# Patient Record
Sex: Male | Born: 1945 | Race: White | Hispanic: No | Marital: Married | Smoking: Former smoker
Health system: Southern US, Community
[De-identification: ages and names within clinical notes are randomized; demographics above are authoritative.]

## PROBLEM LIST (undated history)

## (undated) DIAGNOSIS — I10 Essential (primary) hypertension: Secondary | ICD-10-CM

## (undated) DIAGNOSIS — E119 Type 2 diabetes mellitus without complications: Secondary | ICD-10-CM

## (undated) DIAGNOSIS — E039 Hypothyroidism, unspecified: Secondary | ICD-10-CM

## (undated) DIAGNOSIS — I2510 Atherosclerotic heart disease of native coronary artery without angina pectoris: Secondary | ICD-10-CM

## (undated) DIAGNOSIS — E785 Hyperlipidemia, unspecified: Secondary | ICD-10-CM

## (undated) DIAGNOSIS — I6529 Occlusion and stenosis of unspecified carotid artery: Secondary | ICD-10-CM

## (undated) DIAGNOSIS — N529 Male erectile dysfunction, unspecified: Secondary | ICD-10-CM

## (undated) DIAGNOSIS — E669 Obesity, unspecified: Secondary | ICD-10-CM

## (undated) DIAGNOSIS — I4891 Unspecified atrial fibrillation: Secondary | ICD-10-CM

## (undated) DIAGNOSIS — I9789 Other postprocedural complications and disorders of the circulatory system, not elsewhere classified: Secondary | ICD-10-CM

## (undated) DIAGNOSIS — G4733 Obstructive sleep apnea (adult) (pediatric): Secondary | ICD-10-CM

## (undated) HISTORY — DX: Hyperlipidemia, unspecified: E78.5

## (undated) HISTORY — DX: Hypothyroidism, unspecified: E03.9

## (undated) HISTORY — DX: Obesity, unspecified: E66.9

## (undated) HISTORY — PX: CORONARY ARTERY BYPASS GRAFT: SHX141

## (undated) HISTORY — DX: Other postprocedural complications and disorders of the circulatory system, not elsewhere classified: I97.89

## (undated) HISTORY — DX: Unspecified atrial fibrillation: I48.91

## (undated) HISTORY — DX: Obstructive sleep apnea (adult) (pediatric): G47.33

## (undated) HISTORY — DX: Occlusion and stenosis of unspecified carotid artery: I65.29

## (undated) HISTORY — PX: CARDIAC CATHETERIZATION: SHX172

## (undated) HISTORY — DX: Atherosclerotic heart disease of native coronary artery without angina pectoris: I25.10

## (undated) HISTORY — DX: Male erectile dysfunction, unspecified: N52.9

## (undated) HISTORY — DX: Essential (primary) hypertension: I10

## (undated) HISTORY — DX: Type 2 diabetes mellitus without complications: E11.9

---

## 2005-03-19 ENCOUNTER — Ambulatory Visit (HOSPITAL_COMMUNITY): Admission: RE | Admit: 2005-03-19 | Discharge: 2005-03-19 | Payer: Self-pay | Admitting: Vascular Surgery

## 2005-03-19 ENCOUNTER — Encounter: Payer: Self-pay | Admitting: Vascular Surgery

## 2005-03-22 ENCOUNTER — Ambulatory Visit (HOSPITAL_COMMUNITY): Admission: RE | Admit: 2005-03-22 | Discharge: 2005-03-22 | Payer: Self-pay | Admitting: Cardiology

## 2005-05-07 ENCOUNTER — Encounter (INDEPENDENT_AMBULATORY_CARE_PROVIDER_SITE_OTHER): Payer: Self-pay | Admitting: *Deleted

## 2005-05-07 ENCOUNTER — Ambulatory Visit (HOSPITAL_COMMUNITY): Admission: RE | Admit: 2005-05-07 | Discharge: 2005-05-08 | Payer: Self-pay | Admitting: Vascular Surgery

## 2007-07-17 ENCOUNTER — Inpatient Hospital Stay (HOSPITAL_COMMUNITY): Admission: AD | Admit: 2007-07-17 | Discharge: 2007-07-25 | Payer: Self-pay | Admitting: Cardiology

## 2007-07-20 ENCOUNTER — Ambulatory Visit: Payer: Self-pay | Admitting: Surgery

## 2007-08-13 ENCOUNTER — Ambulatory Visit: Payer: Self-pay | Admitting: Surgery

## 2007-08-20 ENCOUNTER — Encounter (HOSPITAL_COMMUNITY): Admission: RE | Admit: 2007-08-20 | Discharge: 2007-10-14 | Payer: Self-pay | Admitting: Cardiology

## 2007-10-15 ENCOUNTER — Encounter (HOSPITAL_COMMUNITY): Admission: RE | Admit: 2007-10-15 | Discharge: 2007-12-12 | Payer: Self-pay | Admitting: Cardiology

## 2010-09-13 DIAGNOSIS — E119 Type 2 diabetes mellitus without complications: Secondary | ICD-10-CM

## 2010-09-13 HISTORY — DX: Type 2 diabetes mellitus without complications: E11.9

## 2010-11-04 ENCOUNTER — Encounter: Payer: Self-pay | Admitting: Cardiology

## 2010-11-04 ENCOUNTER — Encounter: Payer: Self-pay | Admitting: Surgery

## 2011-02-26 NOTE — Op Note (Signed)
NAME:  Matthew [name], Matthew [name] NO.:  [id number]   MEDICAL RECORD NO.:  [id number]          PATIENT TYPE:  INP   LOCATION:  2313                         FACILITY:  [facility]   PHYSICIAN:  Evelene Croon, M.D.     DATE OF BIRTH:  [birth year]   DATE OF PROCEDURE:  07/20/2007  DATE OF DISCHARGE:                               OPERATIVE REPORT   PREOPERATIVE DIAGNOSIS:  Severe three-vessel coronary disease with  unstable angina.   POSTOPERATIVE DIAGNOSIS:  Severe three-vessel coronary disease with  unstable angina.   OPERATIVE PROCEDURE:  Emergency median sternotomy, extracorporeal  circulation, coronary bypass graft surgery x4 using a left internal  mammary artery graft to the left anterior descending coronary, with a  saphenous vein graft to the obtuse marginal branch of the left  circumflex coronary, a saphenous vein graft to the posterior descending  branch of the right coronary, and a saphenous vein graft to the acute  marginal coronary artery.  Endoscopic vein harvesting from the right  leg.   ATTENDING SURGEON:  Dr. Evelene Croon.   ASSISTANT:  Lenise Herald, [facility].   ANESTHESIA:  General endotracheal.   CLINICAL HISTORY:  This patient is a 64 year old gentleman with history  of hyperlipidemia and coronary disease who has known 80% right coronary  stenosis with left-to-right collaterals after catheterization in 2006.  He has been managed medically and has done well until the past month  when he developed exertional angina.  He underwent a stress test last  week which was markedly positive, and he was admitted for observation  and cardiac catheterization.  This was performed with morning and showed  a 95% hazy proximal LAD stenosis.  There was also about 60-70% proximal  left circumflex stenosis.  The right coronary artery was diffusely  diseased with 95% proximal and 80% midvessel stenosis.  The posterior  descending branch filled by left-to-right collaterals.  Left  ventricular  function was normal.  The patient had some chest pain over the weekend  and then had severe chest pain this morning prior to catheterization and  mild chest pain after catheterization.  I was asked to see the patient  in the catheterization lab and felt that proceeding with emergent  coronary bypass graft surgery was best treatment to prevent further  ischemia and infarction.  I discussed the operative procedure with the  patient and his wife.  We discussed alternatives, benefits, and risks  including but not limited to bleeding, blood transfusion, infection,  stroke, myocardial infarction, graft failure, and death.  They  understood and agreed to proceed.   OPERATIVE PROCEDURE:  The patient was taken to the operating room and  placed on table in supine position.  After induction of general  endotracheal anesthesia, a Foley catheter was placed in the bladder  using sterile technique.  Then, the chest, abdomen and both lower  extremities were prepped and draped in usual sterile manner.  The  patient had a right femoral arterial sheath which had been inserted in  the catheterization lab, and this was left in place and connected to  monitor.  Then, the chest, abdomen and  both lower extremities were  prepped and draped in usual sterile manner.  The chest was entered  through a median sternotomy incision in the pericardium of the midline.  Examination of the heart showed good ventricular contractility.  The  ascending aorta had no palpable plaques in it.   Then, the left internal mammary artery was harvested from the chest wall  as pedicle graft.  This is a medium caliber vessel with excellent blood  flow through it.  At the same time, a segment of greater saphenous vein  was harvested from the right leg using endoscopic vein harvest  technique.  This vein was of medium size and good quality.   Then, the patient was heparinized, and when an adequate activated  clotting time  was achieved, the distal ascending aorta was cannulated  using a 20-French aortic cannula for arterial inflow.  Venous outflow  was achieved using a two-stage venous cannula through the right atrial  appendage.  An antegrade cardioplegia and vent cannula were inserted  into the aortic root.   The patient was placed on cardiopulmonary bypass and the distal  coronaries identified.  The LAD was diffusely diseased throughout its  proximal and midportions.  Distally, a vessel was suitable for grafting.  The obtuse marginal branch was a large graftable vessel with no distal  disease in it.  The right coronary was diffusely diseased and gave off a  moderate-sized posterior descending branch that was heavily diseased  proximally.  There was also a moderate sized acute marginal branch that  was suitable for grafting.   Then, the aorta was crossclamped, and 1000 mL of cold blood antegrade  cardioplegia was administered in the aortic root with quick arrest of  the heart.  Systemic hypothermia to 28 degrees centigrade and topical  hypothermic iced saline was used.  A temperature probe was placed in  septum and insulating pad in the pericardium.  Additional doses of  antegrade cardioplegia were given down each vein graft.   The first distal anastomosis was performed to the obtuse marginal  branch.  The internal diameter of this vessel was about 1.75 to 2 mm.  Conduit used was a segment of greater saphenous vein and anastomosis  performed in end-to-side manner using continuous 7-0 Prolene suture.  Flow was noted through the graft and was excellent.   The second distal anastomosis was performed to the posterior descending  coronary artery.  The internal diameter of this vessel was about 1.6 mm.  Conduit used was a second segment of greater saphenous vein and the  anastomosis performed in an end-to-side manner using continuous 7-0  Prolene suture.  Flow was noted through the graft and was  excellent.   The third distal anastomosis was performed to the acute marginal branch  of the right coronary artery.  The internal diameter of his vessel was  1.6 mm.  Conduit used was a third segment of greater saphenous vein and  the anastomosis performed in an end-to-side manner using continuous 7-0  Prolene suture.  Flow was noted through the graft and was excellent.   The fourth distal anastomosis was performed to the distal LAD.  The  internal diameter was 1.75 mm.  Conduit used was the left internal  mammary graft, and this was brought through an opening in the left  pericardium anterior to the phrenic nerve.  It was anastomosed to the  LAD in an end-to-side manner using continuous 8-0 Prolene suture.  The  pedicle was sutured to  the epicardium with 6-0 Prolene sutures.  The  patient was rewarmed to 37 degrees centigrade.  With crossclamp in  place, three proximal vein graft anastomosis were performed in the  aortic root in an end-to-side manner using continuous 6-0 Prolene  suture.  The clamp was removed from mammary pedicle.  There was rapid  warming of the ventricular septum and return of spontaneous ventricular  fibrillation.  The crossclamp was removed at a time of 79 minutes, and  the patient was defibrillated into sinus rhythm.   The proximal and distal anastomoses appeared hemostatic and lay of the  grafts satisfactory.  Graft markers were placed around the proximal  anastomoses.  Two temporary right ventricular and right atrial pacing  wires were placed and brought the skin.   When the patient had rewarmed to 37 degrees centigrade, he was weaned  from cardiopulmonary bypass on no inotropic agents.  Total bypass time  was 96 minutes.  Cardiac function appeared excellent with a cardiac  output of 5 liters per minute.  Protamine was given, and the venous and  aortic cannula was removed without difficulty.  Hemostasis was achieved  without difficulty.  Three chest tubes  were placed with a tube in the  posterior pericardium, one in the left pleural space, and one in the  anterior mediastinum.  The sternum was then closed with double #6  stainless steel wires.  The fascia was closed with continuous #1 Vicryl  suture.  Subcutaneous tissue was closed with continuous 2-0 Vicryl and  skin with 3-0 Vicryl subcuticular closure.  The lower extremity vein  harvest site was closed in layers in a similar manner.  The sponge,  needle and counts were correct according to the scrub nurse.  Dry  sterile dressings were applied over the incisions, around the chest  tubes which were hooked to Pleur-Evac suction.  The patient remained  hemodynamically stable and was transported to the SICU in guarded but  stable condition.      Evelene Croon, M.D.  Electronically Signed     BB/MEDQ  D:  07/20/2007  T:  07/21/2007  Job:  [id number]   cc:   Evelene Croon, M.D.  Armanda Magic, M.D.

## 2011-02-26 NOTE — Cardiovascular Report (Signed)
NAME:  Matthew [name], Matthew [name] NO.:  [id number]   MEDICAL RECORD NO.:  [id number]          PATIENT TYPE:  INP   LOCATION:  2028                         FACILITY:  [facility]   PHYSICIAN:  Armanda Magic, M.D.     DATE OF BIRTH:  1945-10-27   DATE OF PROCEDURE:  07/20/2007  DATE OF DISCHARGE:  07/25/2007                            CARDIAC CATHETERIZATION   PROCEDURES:  1. Left heart catheterization.  2. Coronary angiography.  3. Left ventriculography.   OPERATOR:  Armanda Magic, M.D.   INDICATIONS:  Coronary disease and now with exertional angina and  abnormal Cardiolite.   The patient was brought to the cardiac catheterization laboratory in the  fasting nonsedated state.  Informed consent was obtained.  The patient  was connected to continue his heart rate and pulse oximetry monitoring,  intermittent blood pressure monitoring.  The right groin was prepped and  draped in a sterile fashion.  Xylocaine 1% was used for local  anesthesia.  Using a modified Seldinger technique, a 6-French sheath was  placed in the right femoral artery.  Under fluoroscopic guidance, a 6-  [place] JL4 catheter was placed in the left coronary artery.  Multiple  cine films were taken at 30 degree RAO, 40 degree LAO views.  This  catheter was then exchanged out over a guide wire for a 6 [place] JR4  catheter which was placed under fluoroscopic guidance in the right  coronary artery.  Multiple cine films are taken at 30 degree RAO , 40  degree LAO views.  The catheter was then exchanged out of a guide wire  for a 6 French angled pigtail catheter which was placed under  fluoroscopic guidance in the left ventricular cavity.  Left  ventriculography was performed in the 30 degree RAO view using a total  of 30 mL of contrast up to 10 mL per second.  The catheter was then  pulled back across the aortic valve with no significant gradient noted.  At the end of the procedure, all catheters and sheaths were  removed.  Manual compression was performed until adequate hemostasis was obtained.  The patient was transferred back to her room in stable condition.   RESULTS:  1. Left main coronary artery is widely patent and trifurcates in the      left anterior descending artery, ramus branch and left circumflex      artery.  2. The left anterior descending artery has an ostial 95% stenosis.  It      then gives rise to two large diagonal branches, both of which are      widely patent, as well as two septal perforator branches.  The      ongoing LAD traverses the apex and is widely patent.  3. There is a ramus branch which is small with 90% proximal stenosis.  4. The left circumflex has an eccentric 60 to 70% proximal stenosis      and then gives rise to the first obtuse marginal branch which is      rather large and widely patent.  The ongoing circumflex traverses      the  AV groove and gives rise to a second obtuse marginal branch      which is widely patent.  The left circumflex terminates in a third      obtuse marginal branch which is widely patent.  There is evidence      of left to right collaterals feeding the distal RCA.  5. The right coronary artery is diffusely diseased with a 90%  tubular      stenosis in the proximal portion and an 80% stenosis in the distal      portion.  The vessel then bifurcates in a posterior descending      artery, posterolateral artery, both of which are widely patent.  6. Left ventriculography shows normal LV function, EF 60%, LVEDP 10      mmHg, LV pressure 154/-4 mmHg.  Aortic pressure 150/74 mmHg.   ASSESSMENT:  1. Severe three-vessel coronary artery disease from left to right      collaterals to the distal right coronary artery.  2. Normal left ventricular function.   PLAN:  CVTS consult.  Start IV heparin, discontinue Lovenox and continue  aspirin.      Armanda Magic, M.D.  Electronically Signed     TT/MEDQ  D:  08/19/2007  T:  08/19/2007  Job:   [id number]   cc:   Pam Drown, M.D.

## 2011-02-26 NOTE — Consult Note (Signed)
NAME:  Matthew [name], Matthew [name] NO.:  [id number]   MEDICAL RECORD NO.:  [id number]          PATIENT TYPE:  INP   LOCATION:  2399                         FACILITY:  [facility]   PHYSICIAN:  Evelene Croon, M.D.     DATE OF BIRTH:  11-Jul-1946   DATE OF CONSULTATION:  07/20/2007  DATE OF DISCHARGE:                                 CONSULTATION   REASON FOR CONSULTATION:  Severe 3-vessel coronary disease with unstable  angina.   CLINICAL HISTORY:  I was asked by Dr. Armanda Magic to evaluate Mr.  [name] in the catheterization lab for consideration of coronary  artery bypass graft surgery.  He is a 65 year old gentleman with a  history of hyperlipidemia and known coronary disease who underwent  catheterization in 2006 after an abnormal Cardiolite scan done  preoperatively before a right carotid endarterectomy.  The  catheterization showed a proximal 80% right coronary stenosis with left  to right collaterals.  He was treated medically.  The patient continued  to do well and was asymptomatic until about 1 week prior to admission  when he began having exertional angina as well as some nocturnal angina.  He underwent a stress Cardiolite exam on July 17, 2007 which was  markedly abnormal with ST segment depression in the inferior lateral  leads.  There was diffuse ischemia.  He was admitted directly for  observation.  He did have mild chest pain over the weekend, and this  morning when he was being moved on to the gurney to come down to the  catheterization lab, he reported having sudden onset of 9/10 chest pain.  This improved with nitroglycerin.  He underwent cardiac catheterization  which showed hazy 95% proximal LAD stenosis.  There was a 95% small  intermediate stenosis.  There was 60 to 70% proximal dominant left  circumflex stenosis.  The right coronary artery had 90% proximal and 80%  mid vessel stenosis with left to right collaterals filling the posterior  descending branch.   Left ventricular function was normal.  LV EDP was  10.  The patient had 2/10 chest pain post catheterization and was  brought to the holding area.  He has had his nitroglycerin titrated  upwards and now become pain free.   PAST MEDICAL HISTORY:  1. His past medical history is significant for coronary artery disease      as mentioned above.  2. He has a history of hyperlipidemia.  3. He has a history of hyperthyroidism and is now on thyroid      replacement therapy.  4. He has a history of carotid artery disease, status post right      carotid endarterectomy in 2006.   REVIEW OF SYSTEMS:  GENERAL:  He denies any fever or chills.  He has had  no recent weight changes.  He has had recent fatigue.  EYES:  Negative.  ENT:  Negative.  ENDOCRINE:  He denies diabetes.  He does have  hyperthyroidism.  CARDIOVASCULAR:  As above.  He has exertional and rest  chest pain.  He denies shortness of breath with exertion.  He denies PND  and orthopnea.  He has had no peripheral edema.  RESPIRATORY:  He denies  cough and sputum production.  GI:  He has had no nausea or vomiting.  He  denies melena and bright red blood per rectum.  GU:  He denies dysuria  and hematuria.  MUSCULOSKELETAL:  Denies arthralgias and myalgias.  NEUROLOGICAL:  He denies any focal weakness or numbness.  Denies any  dizziness or syncope.  Never had a TIA or stroke.  He is status post  right carotid endarterectomy in 2006.   ALLERGIES:  IV CONTRAST DYE.   SOCIAL HISTORY:  He drinks alcohol occasionally.  He does not smoke.   FAMILY HISTORY:  His mother had cardiac disease.  His father had cancer.   MEDICATIONS PRIOR TO ADMISSION:  1. Synthroid 175 mcg daily.  2. HCTZ 37.5 mg daily.  3. Enteric-coated aspirin 325 mg daily.  4. Toprol XL 100 mg daily.  5. Norvasc 5 mg daily.  6. Saw palmetto daily.  7. Crestor 20 mg daily.  8. Imdur 30 mg daily.  9. Sublingual nitroglycerin p.r.n.  10.Fish oil daily.   PHYSICAL  EXAMINATION:  VITAL SIGNS:  His blood pressure is 150/74, pulse  is 70 and regular, respiratory rate is 18 unlabored.  GENERAL:  He is a well-developed white male in no distress.  HEENT EXAM:  Shows to be normocephalic, atraumatic.  Pupils equal and  reactive to light and accommodation.  Extraocular muscles are intact.  Throat is clear.  NECK EXAM:  Shows normal carotid pulses bilaterally.  There are no  bruits.  There is no adenopathy or thyromegaly.  There is an old right  neck scar from carotid endarterectomy.  CARDIAC EXAM:  Shows a regular rate and rhythm with a normal S1, S2.  There is no murmur, rub, or gallop.  LUNGS:  Clear.  ABDOMINAL EXAM:  Shows active bowel sounds.  His abdomen is soft, mildly  obese and nontender.  There are no palpable masses or organomegaly.  EXTREMITY EXAM:  Shows no peripheral edema.  Pedal pulses are palpable  bilaterally.  SKIN:  Warm and dry.  NEUROLOGIC EXAM:  Shows him to be alert and oriented x3.  Motor and  sensory exams are grossly normal.   IMPRESSION:  Matthew [name] has severe 3-vessel coronary disease with an  unstable appearing high-grade proximal left anterior descending stenosis  with unstable angina.  Since he had chest pain at rest this morning and  after catheterization, I think it would be best to proceed with  immediate emergent coronary artery bypass graft surgery.  I discussed  the operative procedure with the patient and his wife.  We discussed  alternatives, benefits, and risks including, but not limited to,  bleeding, blood transfusion, infection, stroke, myocardial infarction,  graft thoracotomy, and death.  He understands and agrees to proceed.      Evelene Croon, M.D.  Electronically Signed     BB/MEDQ  D:  07/20/2007  T:  07/20/2007  Job:  [id number]   cc:   Armanda Magic, M.D.

## 2011-02-26 NOTE — Assessment & Plan Note (Signed)
OFFICE VISIT   Matthew, [name]  DOB:  11/03/1945                                        August 13, 2007  CHART #:  [id number]   The patient returned today for followup status post emergency coronary  artery bypass graft surgery on 07/20/2007.  He said that he has been  feeling great and has had no chest pain or shortness of breath with  ambulation.   PHYSICAL EXAMINATION:  His blood pressure is 177/83 and his pulse is 63  and regular.  Respiratory rate is 18, nonlabored.  Oxygen saturation on  room air is 98%.  He looks well.  Cardiac exam shows a regular rate and  rhythm with normal heart sounds.  His lung exam is clear.  The chest  incision is healing well and the sternum is stable.  His leg incision is  healing well and there is no lower extremity edema.   Followup chest x-ray shows a small left pleural effusion blunting the  left costophrenic angle.  Lungs are otherwise clear.   MEDICATIONS:  1. Aspirin 325 mg daily.  2. Lopressor 50 mg b.i.d.  3. Vytorin 10/80 q.h.s.  4. Amiodarone 200 mg daily.  I told him to complete his amiodarone      prescription and then discontinue it.   IMPRESSION:  Overall, the patient is recovering well following his  surgery.  He has had no further evidence of postoperative atrial  fibrillation.  I encouraged him to continue walking as much as possible.  I told him he could return to driving a car but should refrain if  lifting anything heavier than 10 pounds for a total of 3 months from  date of surgery.  I strongly encouraged him  to participate in cardiac rehab.  He will continue to follow up with Dr.  Armanda Magic and will contact me if he develops any problems with his  incisions.   Evelene Croon, M.D.  Electronically Signed   BB/MEDQ  D:  08/13/2007  T:  08/14/2007  Job:  [id number]   cc:   Armanda Magic, M.D.

## 2011-02-26 NOTE — H&P (Signed)
NAME:  Matthew [name], Matthew [name] NO.:  [id number]   MEDICAL RECORD NO.:  [id number]          PATIENT TYPE:  INP   LOCATION:  4729                         FACILITY:  [facility]   PHYSICIAN:  Armanda Magic, M.D.     DATE OF BIRTH:  [birth year]   DATE OF ADMISSION:  07/17/2007  DATE OF DISCHARGE:                              HISTORY & PHYSICAL   CHIEF COMPLAINT:  Chest pain.   HISTORY OF PRESENT ILLNESS:  Mr. Augello is a 65 year old male  patient with a history of right coronary artery disease in 2006 as a  preop to right carotid endarterectomy.  He underwent cardiac  catheterization following an abnormal Cardiolite.  The catheterization  showed a proximal 80% right coronary artery lesion with left-to-right  collaterals.  He was treated medically.   Patient has been asymptomatic since that time until approximately one  week ago where he began experiencing exertional angina with some  nocturnal angina.  He came in today for a stress Cardiolite.  The test  was significantly positive with ST-segment depression, 3 mm downsloping  in the inferior lateral leads.  Images showed diffuse ischemia.  Now he  is being admitted directly for cardiac observation.   ALLERGIES:  IV CONTRAST ALLERGY.   MEDICATION:  1. Synthroid 175 mcg a day.  2. Hydrochlorothiazide 25 mg 1-1/2 tablet daily.  3. Enteric-coated aspirin 325 mg a day.  4. Toprol XL 100 mg a day.  5. Norvasc 10 mg 1/2 tablet daily.  6. Saw palmetto daily.  7. Crestor 20 mg a day.  8. Imdur 30 mg a day.  9. Sublingual nitroglycerin p.r.n.  10.Fish oil.   SOCIAL HISTORY:  No tobacco or illicit drug use.  Occasional alcohol  use.   FAMILY HISTORY:  Mom with pulmonary edema.  Dad with cancer.   PAST MEDICAL HISTORY:  1. CAD.  2. Hyperlipidemia.  3. He has had hyperthyroidism, and this has been treated and is now on      thyroid replacement therapy.  4. He has had carotid artery disease and has undergone a right carotid     endarterectomy.   PHYSICAL EXAMINATION:  VITAL SIGNS:  Pulse 93, respirations 152/72,  respirations 20.  HEENT:  Grossly normal.  No carotid or subclavian bruits.  No JVD,  thyromegaly.  Sclerae clear.  Conjunctivae normal.  Nares without  drainage.  Right carotid endarterectomy scar.  CHEST: Clear to auscultation bilaterally.  No wheeze, rhonchi.  HEART:  Regular rate and rhythm.  No rubs.  ABDOMEN:  Good bowel sounds, nontender, nondistended.  No mass, no  bruits.  EXTREMITIES:  No peripheral edema.  NEUROLOGICAL: Intact.  SKIN:  Warm and dry.   LABORATORY DATA:  Labs and chest x-ray pending.  EKG shows normal sinus  rhythm, rate 113 with nonspecific ST-T wave changes inferiorly.   ASSESSMENT/PLAN:  1. Unstable angina.  2. Coronary artery disease.  3. Abnormal Cardiolite.  4. IV contrast allergy.  5. Hypothyroidism following hyperthyroidism treatment.  6. Hypertension.  7. Hyperlipidemia.  8. Long-term medication use.   The patient is being admitted to the hospital, placed on  telemetry  monitoring.  Addition of aspirin, beta blocker, lipid lowering agents  and nitroglycerin.  Serial cardiac isoenzymes and EKGs.  The patient is  scheduled for cardiac catheterization on Monday July 20, 2007.      Guy Franco, P.A.      Armanda Magic, M.D.  Electronically Signed    LB/MEDQ  D:  07/17/2007  T:  07/19/2007  Job:  [id number]   cc:   Pam Drown, M.D.  Di Kindle. Edilia Bo, M.D.  Armanda Magic, M.D.

## 2011-02-26 NOTE — Discharge Summary (Signed)
NAME:  Matthew [name], Matthew [name] NO.:  [id number]   MEDICAL RECORD NO.:  [id number]          PATIENT TYPE:  INP   LOCATION:  2028                         FACILITY:  [facility]   PHYSICIAN:  Evelene Croon, M.D.     DATE OF BIRTH:  [birth year]   DATE OF ADMISSION:  07/17/2007  DATE OF DISCHARGE:  07/24/2007                               DISCHARGE SUMMARY   DISCHARGE DIAGNOSES:  1. Severe three-vessel coronary artery disease with unstable angina.  2. Postoperative atrial fibrillation.  3. Volume overload postoperatively.  4. Acute blood loss anemia postoperatively.  5. Hyperlipidemia.  6. History of hyperthyroidism currently on thyroid replacement      therapy.  7. History of carotid disease, status post right carotid      endarterectomy in 2006.   PROCEDURES:  1. Cardiac catheterization.  2. Emergent coronary artery bypass grafting x4 using a left internal      mammary artery graft to left anterior descending coronary,      saphenous vein graft to obtuse marginal branch of left circumflex      coronary artery, saphenous vein graft to posterior descending      branch right coronary artery, saphenous vein graft to acute      marginal coronary artery.  Endoscopic vein harvesting from right      leg.   HISTORY OF PRESENT ILLNESS:  The patient is a 65 year old gentleman with  history of hyperlipidemia and coronary artery disease who has known 80%  right coronary stenosis with left-to-right collaterals after  catheterization in 2006.  He has been managed medically and has been  well until the past month when he developed exertional angina.  He  underwent stress test last week which was markedly positive and the  patient was admitted for observation and cardiac catheterization.  Catheterization performed on October 6, showed a 95%, hazy, proximal LAD  stenosis.  There was also about 60-70%, proximal, left circumflex  stenosis.  The coronary artery was diffusely diseased with 95%  proximal  and 80% midvessel stenosis.  The posterior descending branch filled via  left-to-right collaterals.  Left ventricular function was noted to be  normal.  Following catheterization, Dr. Laneta Simmers was consulted.  Dr.  Laneta Simmers discussed with the patient undergoing coronary artery bypass  grafting.  He discussed with the patient risks and benefits of this  procedure.  He also discussed with the patient taking him to the  operating room emergently due to the size of the blockages in location.  The patient acknowledged understanding and agreed to proceed.  Surgery  was scheduled for July 20, 2007, emergent.  For details of the  patient's past medical history and physical exam, please see dictated  H&P.   HOSPITAL COURSE:  The patient was taken to the operating room July 20, 2007, where he underwent emergency coronary artery bypass grafting x4  using a left internal mammary artery graft to left anterior descending  coronary, saphenous vein graft to obtuse marginal branch to left  circumflex coronary, saphenous vein graft to posterior descending branch  of right coronary, saphenous vein graft to acute marginal coronary  artery.  Endoscopic vein harvesting from the right leg was done.  The  patient tolerated this procedure well and was transferred to the  intensive care unit in stable condition.  Postoperatively, the patient  was noted to be hemodynamically stable.  The patient was extubated the  evening of surgery.  Post extubation, the patient was noted to be alert  and oriented x4 and neurologic intact.  In the intensive care unit, his  vital signs were followed closely.  They were stable and he was able to  be weaned off all drips.  Swan-Ganz catheter discontinued in normal  fashion.  On postop day #1, chest x-ray was clear.  He had minimal  drainage from chest tubes.  Chest tube was discontinued on postop day  #1.  The patient was out of bed ambulating well with cardiac rehab on   postop day #1.  By postop day #2, unfortunately, he went into rapid  atrial fibrillation.  He was started on IV amiodarone.  After initiation  of IV amiodarone, the patient converted back to normal sinus rhythm.  He  remained stable and was able to be transferred out to 2000 postop day  #2.  While on telemetry floor, the patient's vital signs continued to be  monitored.  He remained afebrile.  The patient was able to be weaned off  oxygen saturating greater than 90% on room air.  The patient did have  slight hypertension, but medications were adjusted appropriately.  He  remained in normal sinus rhythm and IV amiodarone was able to be DC.  The patient was switched to p.o. amiodarone.  During the remainder of  his hospital course, the patient remained in normal sinus rhythm.  The  patient's pulmonary status remained stable.  His incisions were clean,  dry and intact and healing well.  The patient did have volume overload  postoperatively and was started on diuretics while in the intensive care  unit.  He was continued these on the floor.  By postop day #4, the  patient was still 2 kg above his preoperative weight.  This will  continue to be monitored.  The patient did have slight acute blood loss  anemia.  It dropped to 10.9 and 32.2 on postop day #2.  The patient was  asymptomatic and this was monitored.  Postoperatively, the patient was  out of bed ambulating well with assistance.  He was tolerating regular  diet well.  No nausea or vomiting noted.   If the patient remains stable and heart rate remains in normal sinus  rhythm, plan is to discharge the patient in the next 24 hours.   FOLLOWUP:  A  followup appointment has been arranged with Dr. Laneta Simmers for  August 11, 2007, at 12:30 p.m.  The patient will need to obtain PA and  lateral chest x-ray 30 minutes prior to this appointment.  Followup  appointment has been arranged with Dr. Mayford Knife for August 06, 2007, at 9  a.m.   ACTIVITY:   The patient is instructed in no driving until released to do  so, no lifting over 10 pounds.  He is told to ambulate 3-4 times per  day, progress as tolerated and to continue his breathing exercises.   WOUND CARE:  The patient is told to shower washing his incisions using  soap and water.  He is to contact the office if he develops any drainage  or opening from any of his incision sites.   DIET:  Educated on  diet to be low-fat, low-salt diet.   DISCHARGE MEDICATIONS:  1. Aspirin 325 mg daily.  2. Lopressor 50 mg b.i.d.  3. Vytorin 10/80 at night.  4. Amiodarone 400 mg b.i.d. x1 week, then 400 mg daily x1 week and      then 200 mg daily.  5. Lasix 40 mg daily x7 days.  6. Potassium chloride 20 mEq daily x7 days.  7. Synthroid 125 mcg daily.  8. Oxycodone 5 mg one to two tablets every 4-6 hours p.r.n. pain.      Theda Belfast, [place]      Evelene Croon, M.D.  Electronically Signed    KMD/MEDQ  D:  07/24/2007  T:  07/24/2007  Job:  [id number]   cc:   Armanda Magic, M.D.

## 2011-03-01 NOTE — Cardiovascular Report (Signed)
NAME:  Matthew [name], Matthew [name] NO.:  [id number]   MEDICAL RECORD NO.:  [id number]          PATIENT TYPE:  OIB   LOCATION:  2899                         FACILITY:  [facility]   PHYSICIAN:  Armanda Magic, M.D.     DATE OF BIRTH:  [birth year]   DATE OF PROCEDURE:  03/22/2005  DATE OF DISCHARGE:  03/22/2005                              CARDIAC CATHETERIZATION   PROCEDURES:  1.  Left heart catheterization.  2.  Coronary angiography.  3.  Left ventriculography.   OPERATOR:  Armanda Magic, M.D.   INDICATIONS:  Abnormal Cardiolite.   COMPLICATIONS:  None.   IV ACCESS:  Via right femoral artery with 6 French sheath.   This is a 65 year old white male who was recently noted to have bilateral  carotid artery bruits and carotid artery Doppler showed severe critical  stenosis of both carotid arteries.  He presented for preop evaluation prior  to undergoing carotid endarterectomy and was found to have an abnormal  stress test and now presents for heart catheterization.   The patient is brought to the cardiac catheterization laboratory in a  fasting nonsedated state.  Informed consent was obtained.  The patient was  connected to continuous heart rate and pulse oximetry monitoring,  intermittent blood pressure monitoring.  The right groin was prepped and  draped in a sterile fashion.  1% Xylocaine was used for local anesthesia.  Using modified Seldinger technique, a 6 French sheath was placed in the  right femoral artery.  Under fluoroscopic guidance, a 6 [place] JL-4 catheter  was placed in the left coronary artery.  Multiple cine films were taken at  30-degree RAO, 40-degree LAO views.  This catheter was then  exchanged out  over guide wire for 6 [place] JR-4 catheter which could not successfully  engage the coronary ostium.  The catheter was exchanged out for a 6 [place]  Noto right coronary artery catheter, again, which could not engage in the  right coronary artery.  The catheter was  then exchanged out over guide wire  for a 6 [place] AL2 catheter.  Again, unable to cannulate the right coronary  ostium.  The catheter was exchanged out over guide wire for a Williams right  coronary artery catheter, a 5 [place], which successfully cannulated the  coronary artery.  Multiple cine films were taken at 30-degree RAO, 40-degree  LAO views.  The catheter was then exchanged over guide wire for 6 French  angled pigtail catheter which was placed under fluoroscopic guidance in the  left ventricular cavity.  Left ventriculography was performed in 30-degree  RAO view using total 30 ml contrast at 15 ml per second.  The catheter was  then pulled back across the aortic valve with no significant gradient noted.  The catheter was then pulled back into the abdominal aorta just at the take  off of the renal arteries, and distal aortography was performed using a  total of 30 mL of contrast at 20 mL per second.  Catheter was then removed  over guide wire.  At the end of the procedure, all catheters and sheaths  were removed.  Manual compression was performed until adequate hemostasis  was obtained.  The patient was transferred back to the room in stable  condition.   RESULTS:  Left main coronary artery is widely patent and bifurcates into the  left anterior descending artery, and left circumflex artery.   The left anterior descending artery is widely patent throughout the course  of the apex and gives rise to three diagonal branches, all o which are  widely patent.  The left circumflex is widely patent throughout its course  giving rise to an obtuse marginal one and two branches; both of which are  widely patent.  The distal circumflex bifurcates into an obtuse marginal  three and posterior descending artery branch.  There is evidence of left-to-  right collaterals filling a nondominant right coronary artery.   The right coronary artery has an 80% ostial stenosis and gives rise to three   acute marginal branches and a posterior lateral branch.  The right coronary  artery is diffusely disease and small up to 80%.   Left ventricular pressure of 131/-1 mmHg, aortic pressure 135/74 mmHg, LV  function normal with EF 65%.   Distal aortography shows mild atherosclerosis of the distal abdominal aorta,  but no evidence of abdominal aortic aneurysm.   ASSESSMENT:  1.  One-vessel obstructive coronary disease of the right coronary artery      with left-to-right collaterals not amenable to percutaneous coronary      intervention secondary to diffuse disease in small caliber vessels.      Medical management.  2.  Normal left ventricular function.   PLAN:  Discharge home after IV fluid bed rest.  Toprol XL 25 mg daily, Imdur  30 mg daily.  Continue other medications.  Follow up with me in two to three  weeks.       TT/MEDQ  D:  03/22/2005  T:  03/22/2005  Job:  [id number]   cc:   Di Kindle. Edilia Bo, M.D.  [address]  [place]  [place] [address]

## 2011-03-01 NOTE — H&P (Signed)
NAME:  LORI, LIEW NO.:  [id number]   MEDICAL RECORD NO.:  [id number]          PATIENT TYPE:  INP   LOCATION:                               FACILITY:  [facility]   PHYSICIAN:  Di Kindle. Edilia Bo, M.D.DATE OF BIRTH:  [birth year]   DATE OF ADMISSION:  03/21/2005  DATE OF DISCHARGE:                                HISTORY & PHYSICAL   REASON FOR ADMISSION:  Tight right carotid stenosis.   HISTORY OF PRESENT ILLNESS:  This is a pleasant 65 year old gentleman who  was found to have a right carotid bruit by Dr. Pam Drown.  He  underwent a duplex scan which showed a right carotid stenosis, and he was  sent over for vascular consultation.  The patient denies any history of  previous stroke, TIA's, expressive or receptive aphasia, or amaurosis fugax.   PAST MEDICAL HISTORY:  1.  Significant for hypertension.  2.  Hypercholesterolemia.  He denies any history of diabetes, history of a previous myocardial  infarction, or a history of congestive heart failure.  1.  He does state that he had some problems with palpitations 15 years ago      and underwent a cardiac catheterization at that time, which showed some      mild coronary disease.  2.  At that time it turned out that he had hyperthyroidism and underwent an      ablation procedure.  He has had no further problems with palpitations.   FAMILY HISTORY:  His mother died with complications of coronary artery  disease and cancer when she was older.  His father died with esophageal  cancer.  He is unaware of any history of premature cardiovascular disease.   SOCIAL HISTORY:  He is married and has two children.  He is retired from  [employer].  He quit tobacco 15 years ago.   MEDICATIONS:  1.  Lotrel 10/20 mg, one p.o. daily.  2.  Zocor 40 mg p.o. daily.  3.  Synthroid 125 mcg p.o. daily.  4.  Hydrochlorothiazide 25 mg p.o. daily.  5.  Aspirin 325 mg p.o. daily.   ALLERGIES:  No known drug allergies.   REVIEW OF SYSTEMS:  GENERAL:  He has had some mild weight gain recently.  He  denies any change in appetite or fever.  CARDIAC:  He has had no chest pain,  chest pressure, palpitations or arrhythmias.  He does admit to some mild  dyspnea on exertion.  PULMONARY:  He has had no productive cough,  bronchitis, asthma or wheezing.  GI:  He has had no recent change in his  bowel habits, and has no history of peptic ulcer disease.  GENITOURINARY:  He has had no dysuria or frequency.  VASCULAR:  He has had no claudication,  rest pain or non-healing ulcers.  He has had no history of deep venous  thrombosis of phlebitis.  NEUROLOGIC:  He has had no dizziness, blackouts,  headaches or seizures.  ORTHOPEDIC/SKIN:  He does have a history of  arthritis.  He has had no muscle pain or rash.  PSYCHIATRIC:  He has had no  depression or nervousness.  HEMATOLOGIC:  He has had no bleeding problems or  clotting disorders.   PHYSICAL EXAMINATION:  VITAL SIGNS:  Blood pressure 140/82, heart rate 76.  NECK:  He has a right carotid bruit.  There is no cervical lymphadenopathy.  LUNGS:  Clear bilaterally to auscultation.  HEART:  He has a regular rate and rhythm.  ABDOMEN:  Soft, nontender.  I could not palpable an aneurysm.  EXTREMITIES:  He has palpable femoral pulses, palpable pedal pulses  bilaterally.  He has no significant lower extremity swelling.  NEUROLOGIC:  Exam is nonfocal.   A carotid duplex scan shows a very tight right carotid stenosis with a peak  systolic velocity of 475 cm per second with an end diastolic velocity of 149  cm per second.  The stenosis appears to be at the level of the bifurcation  and proximal internal carotid artery.  On the left side he has a 60%-79%  stenosis.  Both vertebral arteries are patent, with normally-directed flow.   PLAN:  Given the severity of the right carotid stenosis, I have recommended  a right carotid endarterectomy to lower his risk of future stroke.  We  have  discussed indications for the procedure and the potential complications,  including but not limited to bleeding, stroke (peri-procedural risks of 1%-  2%), myocardial infarction, nerve injury or other unpredictable medical  problems.  All of his questions were answered and he is agreeable to  proceed.  As he is asymptomatic, I think it would be safest to go ahead and  proceed with his stress test on March 18, 2005, and assuming that this is  unremarkable, will proceed with surgery on March 21, 2005.  He knows to  continue his aspirin right up through surgery.      CSD/MEDQ  D:  03/13/2005  T:  03/13/2005  Job:  [id number]   cc:   Pam Drown, M.D.  [address]  [place]  [place] [address]  Fax: [phone]

## 2011-03-01 NOTE — Op Note (Signed)
NAME:  OMA, ALPERT NO.:  [id number]   MEDICAL RECORD NO.:  [id number]          PATIENT TYPE:  OIB   LOCATION:  2899                         FACILITY:  [facility]   PHYSICIAN:  Di Kindle. Edilia Bo, M.D.DATE OF BIRTH:  1945-11-11   DATE OF PROCEDURE:  05/07/2005  DATE OF DISCHARGE:                                 OPERATIVE REPORT   PREOPERATIVE DIAGNOSIS:  Asymptomatic greater than 80% right carotid  stenosis.   POSTOPERATIVE DIAGNOSIS:  Asymptomatic greater than 80% right carotid  stenosis.   PROCEDURE:  Right carotid endarterectomy with Dacron patch angioplasty.   SURGEON:  Waverly Ferrari, M.D.   ASSISTANT:  Shonna Chock, PA   ANESTHESIA:  General.   INDICATIONS:  This is a 65 year old gentleman who was found to have a right  carotid bruit by Dr. Uvaldo Rising and this prompted a duplex scan which showed a  greater than 80% right carotid stenosis. Right carotid endarterectomy was  recommended in order to lower his risk of future stroke. The procedure and  potential complications including but not limited to bleeding, stroke  (periprocedural risk 1-2%), MI, nerve injury, or other unpredictable medical  problems were discussed with the patient preoperatively. All his questions  were answered and he was agreeable to proceed.   TECHNIQUE:  The patient was taken to the operating room and received a  general anesthetic. The right neck was prepped and draped in the usual  sterile fashion. An incision was made along the anterior border of the  sternocleidomastoid and the dissection carried down to the common carotid  artery which was dissected free and controlled with a Rumel tourniquet. The  fascial vein was divided between 2-0 silk ties and the internal carotid  artery above the plaque was controlled with a blue vessel loop. The external  carotid artery was controlled with a blue vessel loop. The patient was then  heparinized. Clamps were then placed on the  internal, then the common, then  the external carotid artery. A longitudinal arteriotomy was made in the  common carotid artery and this was extended through the plaque into the  internal carotid artery above the plaque. A #10 shunt was placed into the  internal carotid artery, back bled, and then placed into the common carotid  artery and secured with a Rumel tourniquet. Flow was reestablished to the  shunt. An endarterectomy plane was established proximally and the plaque was  sharply divided. Eversion endarterectomy was performed of the external  carotid artery. Distally, there was a nice taper in the plaque and no  tacking sutures were required. The artery was irrigated with copious amounts  of heparin and dextran, and all loose debris removed. Dacron patch was then  sewn using continuous 6-0 Prolene suture. Prior to completing the patch  closure, the shunt was removed, the arteries back bled and flushed  appropriately, and anastomosis completed. Flow was reestablished first to  the external carotid artery and then to the internal carotid artery. At the  completion, there was a good pulse distal to the patch and a good Doppler  signal. Hemostasis was obtained in the wound. The  wound was closed with a deep layer of 3-0 Vicryl. The platysma was closed  with a running 3-0 Vicryl. The skin was closed with a 4-0 subcuticular  stitch. A sterile dressing was applied. The patient tolerated the procedure  well and was transferred to the recovery room in satisfactory condition. All  needle and sponge counts were correct.       CSD/MEDQ  D:  05/07/2005  T:  05/07/2005  Job:  [id number]   cc:   Pam Drown, M.D.  [address]  [place]  [place] [address]  Fax: [phone]

## 2011-03-01 NOTE — H&P (Signed)
NAME:  Matthew [name], Matthew [name] NO.:  [id number]   MEDICAL RECORD NO.:  [id number]          PATIENT TYPE:  OIB   LOCATION:                               FACILITY:  [facility]   PHYSICIAN:  Di Kindle. Edilia Bo, M.D.DATE OF BIRTH:  1946/04/10   DATE OF ADMISSION:  05/07/2005  DATE OF DISCHARGE:                                HISTORY & PHYSICAL   CHIEF COMPLAINT:  Right carotid stenosis.   HISTORY OF PRESENT ILLNESS:  The patient is a 65 year old white male who was  recently referred to Dr. Edilia Bo by Dr. Gweneth Dimitri for evaluation of a  right internal carotid artery stenosis.  The patient recently moved here  from [place] and had seen Dr. Corliss Blacker to become established in her practice.  On physical examination he was noted to have a right carotid bruit.  He  subsequently underwent a duplex scan which showed greater than 80% stenosis  on the right with 60-79% stenosis on the left.  He denies any history of TIA  symptoms, visual changes, amaurosis fugax, dysphagia, dysarthria, weakness,  or syncope.  He was referred to Dr. Edilia Bo for evaluation for possible  surgical intervention.  Dr. Edilia Bo recommended proceeding with a right  carotid endarterectomy at this time in order to decrease his risk of stroke.  Because the patient had had a history of mild coronary artery disease in the  past, Dr. Edilia Bo recommended proceeding with a cardiac work-up by Dr. Armanda Magic prior to surgery.  He had a cardiac catheterization and was found to  have coronary artery disease, single vessel, around 80% stenosis of the  ostial right coronary artery.  This area had evidence of collaterals and Dr.  Mayford Knife felt that he should continue medical management at this time and  cleared him to proceed with surgery.  He returns today for follow-up and an  admission history and physical.  He has remained asymptomatic since his last  visit.   PAST MEDICAL HISTORY:  1.  Coronary artery disease managed  medically.  2.  Hypertension.  3.  Hyperlipidemia.  4.  History of Grave's disease status post thyroid ablation with secondary      hypothyroidism   PAST SURGICAL HISTORY:  1.  Appendectomy.  2.  Tonsillectomy.  3.  Thyroid ablation.   ALLERGIES:  No known drug allergies.  He does report an allergy to IV  CONTRAST DYE which causes itching.   CURRENT MEDICATIONS:  1.  Toprol XL 50 mg daily.  2.  Vytorin 10/80 daily.  3.  Synthroid 125 mcg daily.  4.  Hydrochlorothiazide 25 mg daily.  5.  Enteric-coated aspirin 325 mg daily.   SOCIAL HISTORY:  He is married and resides in [place] with his wife.  He  has two children.  He previously smoked a half pack of cigarettes per day or  few order and smoked for around 10 years.  He quit 15-20 years ago.  He also  occasionally consumes alcohol.  He is retired from the Ambulance person at  [employer].   FAMILY HISTORY:  His mother died of breast cancer.  She also had  hypertension and pulmonary edema.  His father died of esophageal cancer and  had a history of hypertension.  He has one brother who is alive and well.   REVIEW OF SYSTEMS:  See history of present illness for pertinent positives  and negatives.  Also, he reports occasional leg cramps secondary to his  Grave's disease.  He wears glasses.  He denies fevers, chills, recent  infections, weight loss, neurologic symptoms as described above, chest pain,  palpitations, shortness of breath, dyspnea on exertion, orthopnea,  paroxysmal nocturnal dyspnea, cough, wheezing, abdominal pain, nausea,  vomiting, diarrhea, constipation, reflux symptoms, hematochezia, melena,  hematemesis, hematuria, nocturia or dysuria, lower extremity edema,  claudication symptoms, rest pain, anxiety, depression, intolerance to heat  or cold.   PHYSICAL EXAMINATION:  VITAL SIGNS:  Blood pressure is 142/80, heart rate 64  and regular, respirations 18 and unlabored.  GENERAL:  This is a well-developed,  well-nourished white male in no acute  distress.  HEENT:  Normocephalic, atraumatic.  Pupils equal, round, and react to light  and accommodation.  Extraocular movements intact.  TMs and canals are clear.  Nares patent bilaterally.  Oropharynx clear with moist mucous membranes and  an upper partial and place.  NECK:  Supple without lymphadenopathy or thyromegaly.  There is an audible  right carotid bruit.  HEART:  Regular rate and rhythm without murmurs, rubs, or gallops.  LUNGS:  Clear to auscultation.  ABDOMEN:  Soft, obese, nontender, nondistended with active bowel sounds in  all quadrants.  No masses or hepatosplenomegaly.  EXTREMITIES:  No clubbing, cyanosis, or edema.  He has 2+ palpable femoral,  dorsalis pedis, and posterior tibial pulses bilaterally.  NEUROLOGIC:  Cranial nerves II-XII grossly intact.  He is alert and oriented  x3.  His gait is within normal limits.  Cerebellar function is intact.  His  muscle strength is symmetrical in the upper and lower extremities.   ASSESSMENT/PLAN:  This is a 65 year old male with asymptomatic right  internal carotid artery stenosis.  He will be admitted to [facility] on May 07, 2005 and will undergo a right carotid endarterectomy by  Dr. Waverly Ferrari.       GC/MEDQ  D:  05/03/2005  T:  05/03/2005  Job:  [id number]   cc:   Armanda Magic, M.D.  [address]  [place], [place] [address]  Fax: [phone]   Pam Drown, M.D.  [address]  [place]  [place] [address]  Fax: [phone]

## 2011-05-10 ENCOUNTER — Ambulatory Visit: Payer: Medicare Other | Attending: Family Medicine | Admitting: Physical Therapy

## 2011-05-10 DIAGNOSIS — M545 Low back pain, unspecified: Secondary | ICD-10-CM | POA: Insufficient documentation

## 2011-05-10 DIAGNOSIS — IMO0001 Reserved for inherently not codable concepts without codable children: Secondary | ICD-10-CM | POA: Insufficient documentation

## 2011-05-10 DIAGNOSIS — M2569 Stiffness of other specified joint, not elsewhere classified: Secondary | ICD-10-CM | POA: Insufficient documentation

## 2011-05-13 ENCOUNTER — Ambulatory Visit: Payer: Medicare Other | Admitting: Physical Therapy

## 2011-05-16 ENCOUNTER — Ambulatory Visit: Payer: Medicare Other | Attending: Family Medicine | Admitting: Physical Therapy

## 2011-05-16 DIAGNOSIS — M545 Low back pain, unspecified: Secondary | ICD-10-CM | POA: Insufficient documentation

## 2011-05-16 DIAGNOSIS — IMO0001 Reserved for inherently not codable concepts without codable children: Secondary | ICD-10-CM | POA: Insufficient documentation

## 2011-05-16 DIAGNOSIS — M2569 Stiffness of other specified joint, not elsewhere classified: Secondary | ICD-10-CM | POA: Insufficient documentation

## 2011-05-21 ENCOUNTER — Ambulatory Visit: Payer: Medicare Other | Admitting: Physical Therapy

## 2011-05-24 ENCOUNTER — Ambulatory Visit: Payer: Medicare Other | Admitting: Physical Therapy

## 2011-05-28 ENCOUNTER — Ambulatory Visit: Payer: Medicare Other | Admitting: Physical Therapy

## 2011-05-30 ENCOUNTER — Ambulatory Visit: Payer: Medicare Other | Admitting: Physical Therapy

## 2011-05-31 ENCOUNTER — Other Ambulatory Visit: Payer: Self-pay | Admitting: Family Medicine

## 2011-05-31 DIAGNOSIS — M545 Low back pain: Secondary | ICD-10-CM

## 2011-06-04 ENCOUNTER — Encounter: Payer: Medicare Other | Admitting: Physical Therapy

## 2011-06-04 ENCOUNTER — Ambulatory Visit
Admission: RE | Admit: 2011-06-04 | Discharge: 2011-06-04 | Disposition: A | Discharge status: Home / Self Care | Payer: Medicare Other | Source: Ambulatory Visit | Attending: Family Medicine | Admitting: Family Medicine

## 2011-06-04 DIAGNOSIS — M545 Low back pain: Secondary | ICD-10-CM

## 2011-06-06 ENCOUNTER — Encounter: Payer: Medicare Other | Admitting: Physical Therapy

## 2011-06-11 ENCOUNTER — Ambulatory Visit: Payer: Medicare Other | Admitting: Physical Therapy

## 2011-06-13 ENCOUNTER — Encounter: Payer: Medicare Other | Admitting: Physical Therapy

## 2011-06-28 ENCOUNTER — Inpatient Hospital Stay (HOSPITAL_COMMUNITY): Admission: RE | Admit: 2011-06-28 | Payer: Medicare Other | Source: Ambulatory Visit | Admitting: Neurological Surgery

## 2011-07-18 ENCOUNTER — Encounter (HOSPITAL_COMMUNITY)
Admission: RE | Admit: 2011-07-18 | Discharge: 2011-07-18 | Disposition: A | Discharge status: Home / Self Care | Payer: Medicare Other | Source: Ambulatory Visit | Attending: Neurological Surgery | Admitting: Neurological Surgery

## 2011-07-18 ENCOUNTER — Other Ambulatory Visit (HOSPITAL_COMMUNITY): Payer: Self-pay | Admitting: Neurological Surgery

## 2011-07-18 DIAGNOSIS — M48061 Spinal stenosis, lumbar region without neurogenic claudication: Secondary | ICD-10-CM

## 2011-07-18 LAB — CBC
Hemoglobin: 15.8 g/dL (ref 13.0–17.0)
MCH: 31.8 pg (ref 26.0–34.0)
MCHC: 35.2 g/dL (ref 30.0–36.0)
MCV: 90.3 fL (ref 78.0–100.0)
RBC: 4.97 MIL/uL (ref 4.22–5.81)

## 2011-07-18 LAB — BASIC METABOLIC PANEL
BUN: 15 mg/dL (ref 6–23)
CO2: 29 mEq/L (ref 19–32)
Calcium: 10 mg/dL (ref 8.4–10.5)
Chloride: 102 mEq/L (ref 96–112)
GFR calc non Af Amer: 69 mL/min — ABNORMAL LOW (ref >90)
Potassium: 4.5 mEq/L (ref 3.5–5.1)
Sodium: 140 mEq/L (ref 135–145)

## 2011-07-18 LAB — APTT: aPTT: 39 seconds — ABNORMAL HIGH (ref 24–37)

## 2011-07-18 LAB — DIFFERENTIAL
Basophils Relative: 1 % (ref 0–1)
Monocytes Absolute: 0.9 10*3/uL (ref 0.1–1.0)
Neutro Abs: 6.4 10*3/uL (ref 1.7–7.7)
Neutrophils Relative %: 67 % (ref 43–77)

## 2011-07-18 LAB — TYPE AND SCREEN: ABO/RH(D): A POS

## 2011-07-18 LAB — PROTIME-INR
INR: 0.97 (ref 0.00–1.49)
Prothrombin Time: 13.1 seconds (ref 11.6–15.2)

## 2011-07-24 ENCOUNTER — Inpatient Hospital Stay (HOSPITAL_COMMUNITY)
Admission: RE | Admit: 2011-07-24 | Discharge: 2011-07-25 | DRG: 491 | Disposition: A | Discharge status: Home / Self Care | Payer: Medicare Other | Source: Ambulatory Visit | Attending: Neurological Surgery | Admitting: Neurological Surgery

## 2011-07-24 ENCOUNTER — Inpatient Hospital Stay (HOSPITAL_COMMUNITY): Payer: Medicare Other

## 2011-07-24 DIAGNOSIS — I2510 Atherosclerotic heart disease of native coronary artery without angina pectoris: Secondary | ICD-10-CM | POA: Diagnosis present

## 2011-07-24 DIAGNOSIS — Z01818 Encounter for other preprocedural examination: Secondary | ICD-10-CM

## 2011-07-24 DIAGNOSIS — Z01812 Encounter for preprocedural laboratory examination: Secondary | ICD-10-CM

## 2011-07-24 DIAGNOSIS — Z01810 Encounter for preprocedural cardiovascular examination: Secondary | ICD-10-CM

## 2011-07-24 DIAGNOSIS — E119 Type 2 diabetes mellitus without complications: Secondary | ICD-10-CM | POA: Diagnosis present

## 2011-07-24 DIAGNOSIS — E669 Obesity, unspecified: Secondary | ICD-10-CM | POA: Diagnosis present

## 2011-07-24 DIAGNOSIS — Z87891 Personal history of nicotine dependence: Secondary | ICD-10-CM

## 2011-07-24 DIAGNOSIS — Z79899 Other long term (current) drug therapy: Secondary | ICD-10-CM

## 2011-07-24 DIAGNOSIS — M5126 Other intervertebral disc displacement, lumbar region: Principal | ICD-10-CM | POA: Diagnosis present

## 2011-07-24 DIAGNOSIS — Z7982 Long term (current) use of aspirin: Secondary | ICD-10-CM

## 2011-07-24 DIAGNOSIS — G4733 Obstructive sleep apnea (adult) (pediatric): Secondary | ICD-10-CM | POA: Diagnosis present

## 2011-07-24 DIAGNOSIS — I10 Essential (primary) hypertension: Secondary | ICD-10-CM | POA: Diagnosis present

## 2011-07-24 LAB — GLUCOSE, CAPILLARY
Glucose-Capillary: 132 mg/dL — ABNORMAL HIGH (ref 70–99)
Glucose-Capillary: 151 mg/dL — ABNORMAL HIGH (ref 70–99)
Glucose-Capillary: 155 mg/dL — ABNORMAL HIGH (ref 70–99)

## 2011-07-25 LAB — BASIC METABOLIC PANEL
BUN: 17
Calcium: 7.4 — ABNORMAL LOW
Calcium: 9
Creatinine, Ser: 1.25
GFR calc Af Amer: >60
GFR calc non Af Amer: 58 — ABNORMAL LOW
GFR calc non Af Amer: >60
GFR calc non Af Amer: >60
Glucose, Bld: 122 — ABNORMAL HIGH
Potassium: 3.7
Potassium: 4.1
Sodium: 137
Sodium: 140
Sodium: 143

## 2011-07-25 LAB — PROTIME-INR
INR: 1
INR: 1
INR: 1.3
Prothrombin Time: 12.9
Prothrombin Time: 16.1 — ABNORMAL HIGH

## 2011-07-25 LAB — URINALYSIS, ROUTINE W REFLEX MICROSCOPIC
Bilirubin Urine: NEGATIVE
Nitrite: NEGATIVE
Specific Gravity, Urine: 1.019
pH: 7

## 2011-07-25 LAB — COMPREHENSIVE METABOLIC PANEL
ALT: 43
AST: 31
Albumin: 3.9
Alkaline Phosphatase: 40
BUN: 11
CO2: 23
Calcium: 8.9
Chloride: 102
Creatinine, Ser: 1.02
GFR calc Af Amer: >60
GFR calc Af Amer: >60
GFR calc non Af Amer: >60
Glucose, Bld: 124 — ABNORMAL HIGH
Potassium: 3.7
Potassium: 3.9
Sodium: 138
Total Bilirubin: 1.2

## 2011-07-25 LAB — CBC
HCT: 35.7 — ABNORMAL LOW
HCT: 45.3
Hemoglobin: 10.9 — ABNORMAL LOW
Hemoglobin: 11.8 — ABNORMAL LOW
Hemoglobin: 12.2 — ABNORMAL LOW
Hemoglobin: 15.2
MCHC: 33.7
MCHC: 33.8
Platelets: 164
Platelets: 202
Platelets: 222
RBC: 4.23
RBC: 4.97
RDW: 13.1
RDW: 13.1
RDW: 13.5
WBC: 12.5 — ABNORMAL HIGH
WBC: 18.1 — ABNORMAL HIGH
WBC: 8.6

## 2011-07-25 LAB — LIPID PANEL
LDL Cholesterol: 69
Total CHOL/HDL Ratio: 3.7
Triglycerides: 134
VLDL: 27

## 2011-07-25 LAB — I-STAT EC8
BUN: 18
Glucose, Bld: 141 — ABNORMAL HIGH
HCT: 36 — ABNORMAL LOW
Hemoglobin: 12.2 — ABNORMAL LOW
Potassium: 4.2
Sodium: 140
TCO2: 24

## 2011-07-25 LAB — POCT I-STAT 3, ART BLOOD GAS (G3+)
O2 Saturation: 100
O2 Saturation: 94
Operator id: 277261
Operator id: 3402
pCO2 arterial: 38.5
pCO2 arterial: 39.2
pCO2 arterial: 43.6
pH, Arterial: 7.383
pO2, Arterial: 103 — ABNORMAL HIGH
pO2, Arterial: 272 — ABNORMAL HIGH

## 2011-07-25 LAB — CARDIAC PANEL(CRET KIN+CKTOT+MB+TROPI)
Relative Index: 1.1
Relative Index: 1.3
Total CK: 277 — ABNORMAL HIGH
Troponin I: 0.06
Troponin I: 0.11 — ABNORMAL HIGH

## 2011-07-25 LAB — POCT I-STAT 4, (NA,K, GLUC, HGB,HCT)
Glucose, Bld: 134 — ABNORMAL HIGH
Glucose, Bld: 139 — ABNORMAL HIGH
HCT: 31 — ABNORMAL LOW
HCT: 33 — ABNORMAL LOW
HCT: 47
Hemoglobin: 10.5 — ABNORMAL LOW
Hemoglobin: 11.2 — ABNORMAL LOW
Hemoglobin: 13.9
Hemoglobin: 14.3
Operator id: 3402
Operator id: 3402
Potassium: 4.7
Potassium: 5.6 — ABNORMAL HIGH
Sodium: 132 — ABNORMAL LOW
Sodium: 132 — ABNORMAL LOW
Sodium: 138
Sodium: 138
Sodium: 138

## 2011-07-25 LAB — CREATININE, SERUM
Creatinine, Ser: 1.26
GFR calc Af Amer: >60
GFR calc non Af Amer: 58 — ABNORMAL LOW

## 2011-07-25 LAB — APTT: aPTT: 37

## 2011-07-25 LAB — CROSSMATCH

## 2011-07-25 LAB — OCCULT BLOOD X 1 CARD TO LAB, STOOL: Fecal Occult Bld: NEGATIVE

## 2011-07-25 LAB — URINE MICROSCOPIC-ADD ON

## 2011-07-25 LAB — TROPONIN I: Troponin I: 0.1 — ABNORMAL HIGH

## 2011-07-25 LAB — PLATELET COUNT: Platelets: 186

## 2011-07-25 LAB — CK TOTAL AND CKMB (NOT AT ARMC): Relative Index: 2

## 2011-08-02 NOTE — Op Note (Signed)
NAME:  Matthew [name], Matthew [name] NO.:  [id number]  MEDICAL RECORD NO.:  [id number]  LOCATION:  3528                         FACILITY:  [facility]  PHYSICIAN:  Tia Alert, MD     DATE OF BIRTH:  [birth year]  DATE OF PROCEDURE:  07/24/2011 DATE OF DISCHARGE:                              OPERATIVE REPORT   PREOPERATIVE DIAGNOSIS:  Spinal stenosis at L3-4 with left L3-4 herniated nucleus pulposus with left L4 radiculopathy and leg weakness.  POSTOPERATIVE DIAGNOSIS:  Spinal stenosis at L3-4 with left L3-4 herniated nucleus pulposus with left L4 radiculopathy and leg weakness.  PROCEDURES:  Left lumbar hemilaminectomy, medial facetectomy, foraminotomy at L3-4 followed by sublaminar decompression for central canal, decompression followed by microdiskectomy at L3-4 on the left utilizing microscopic dissection.  SURGEON:  Tia Alert, MD  ANESTHESIA:  General endotracheal.  COMPLICATIONS:  None apparent.  INDICATION FOR PROCEDURE:  Mr. Hayner is a 65 year old gentleman who presented with pain in his left quadriceps with numbness and weakness. He had an MRI which showed spinal stenosis at L3-4 with a large free fragment extending down to the pedicle level compressing the left L4 nerve root.  I recommended decompressive laminectomy followed by microdiskectomy.  He understood the risks, benefits, and expected outcome and wished to proceed.  DESCRIPTION OF PROCEDURE:  The patient was taken to the operating room and after induction of adequate generalized endotracheal anesthesia he was rolled into the prone position on the Wilson frame.  All pressure points were padded.  His lumbar region was cleaned and prepped with DuraPrep and then draped in usual sterile fashion.  3 mL of local anesthesia injected and a dorsal midline incision was made and carried down to the lumbosacral fascia.  The fascia was opened and the paraspinous musculature was taken down in a subperiosteal  fashion to expose L3-4 on the left.  Intraoperative x-ray confirmed my level and then I was able to use the Kerrison punch and a high-speed drill to perform hemilaminectomy, medial facetectomy, and foraminotomy at L3-4 on the left side.  The underlying yellow ligament was opened and removed in a piecemeal fashion to expose the underlying dura and L4 nerve root.  I dissected down and removed the superior part of lamina of L4 to widen the foraminotomy until I was distal to the pedicle.  I drilled up under the spinous process and lamina on the right, performed a sublaminar decompression until I could see the lateral recess on the patient's right side.  I then brought in the operating microscope.  I coagulated the epidural venous vasculature.  I found a free fragment distally at the pedicle level.  This was removed with a nerve hook.  I then removed several fragments with a pituitary rongeur.  I undercut the lateral recess to further decompress the lateral recess.  I inspected the disk. I did not find a large annular tear.  The disk was bulging as was seen on the MRI, but I felt like cutting the anulus and performed intradiscal diskectomy would only increase the risk of recurrent disk herniation for him and therefore I decided not to cut the disk and perform an intradiscal discectomy and simply rely on  the decompressive laminectomy and the fragmentectomy.  I then irrigated with saline solution containing bacitracin, dried all bleeding points with Surgifoam and bipolar cautery and lined the dura with Gelfoam.  After irrigating with copious amounts of bacitracin-containing saline solution, I placed a medium Hemovac drain through a separate stab incision and closed the fascia with 0 Vicryl, closed the subcu and subcuticular tissue with 2-0 and 3-0 Vicryl, and closed the skin with benzoin and Steri-Strips.  The drapes were removed.  Sterile dressing was applied.  The patient awakened from  anesthesia and transferred to the recovery in stable condition.  At the end of procedure, all sponge, needle, and instrument counts were correct.     Tia Alert, MD     DSJ/MEDQ  D:  07/24/2011  T:  07/24/2011  Job:  [id number]  Electronically Signed by Marikay Alar MD on 08/02/2011 10:50:30 AM

## 2012-01-02 ENCOUNTER — Ambulatory Visit: Payer: Medicare Other | Admitting: Physical Therapy

## 2012-01-07 ENCOUNTER — Ambulatory Visit: Payer: Medicare Other | Admitting: Physical Therapy

## 2012-05-12 IMAGING — CR DG CHEST 2V
2 series · 2 of 2 positions shown · non-contrast
Comparison: Chest x-ray 07/22/2007.

CLINICAL DATA: Preop for lumbar surgery.

CHEST - 2 VIEW

[view not recorded (1 of 2)]
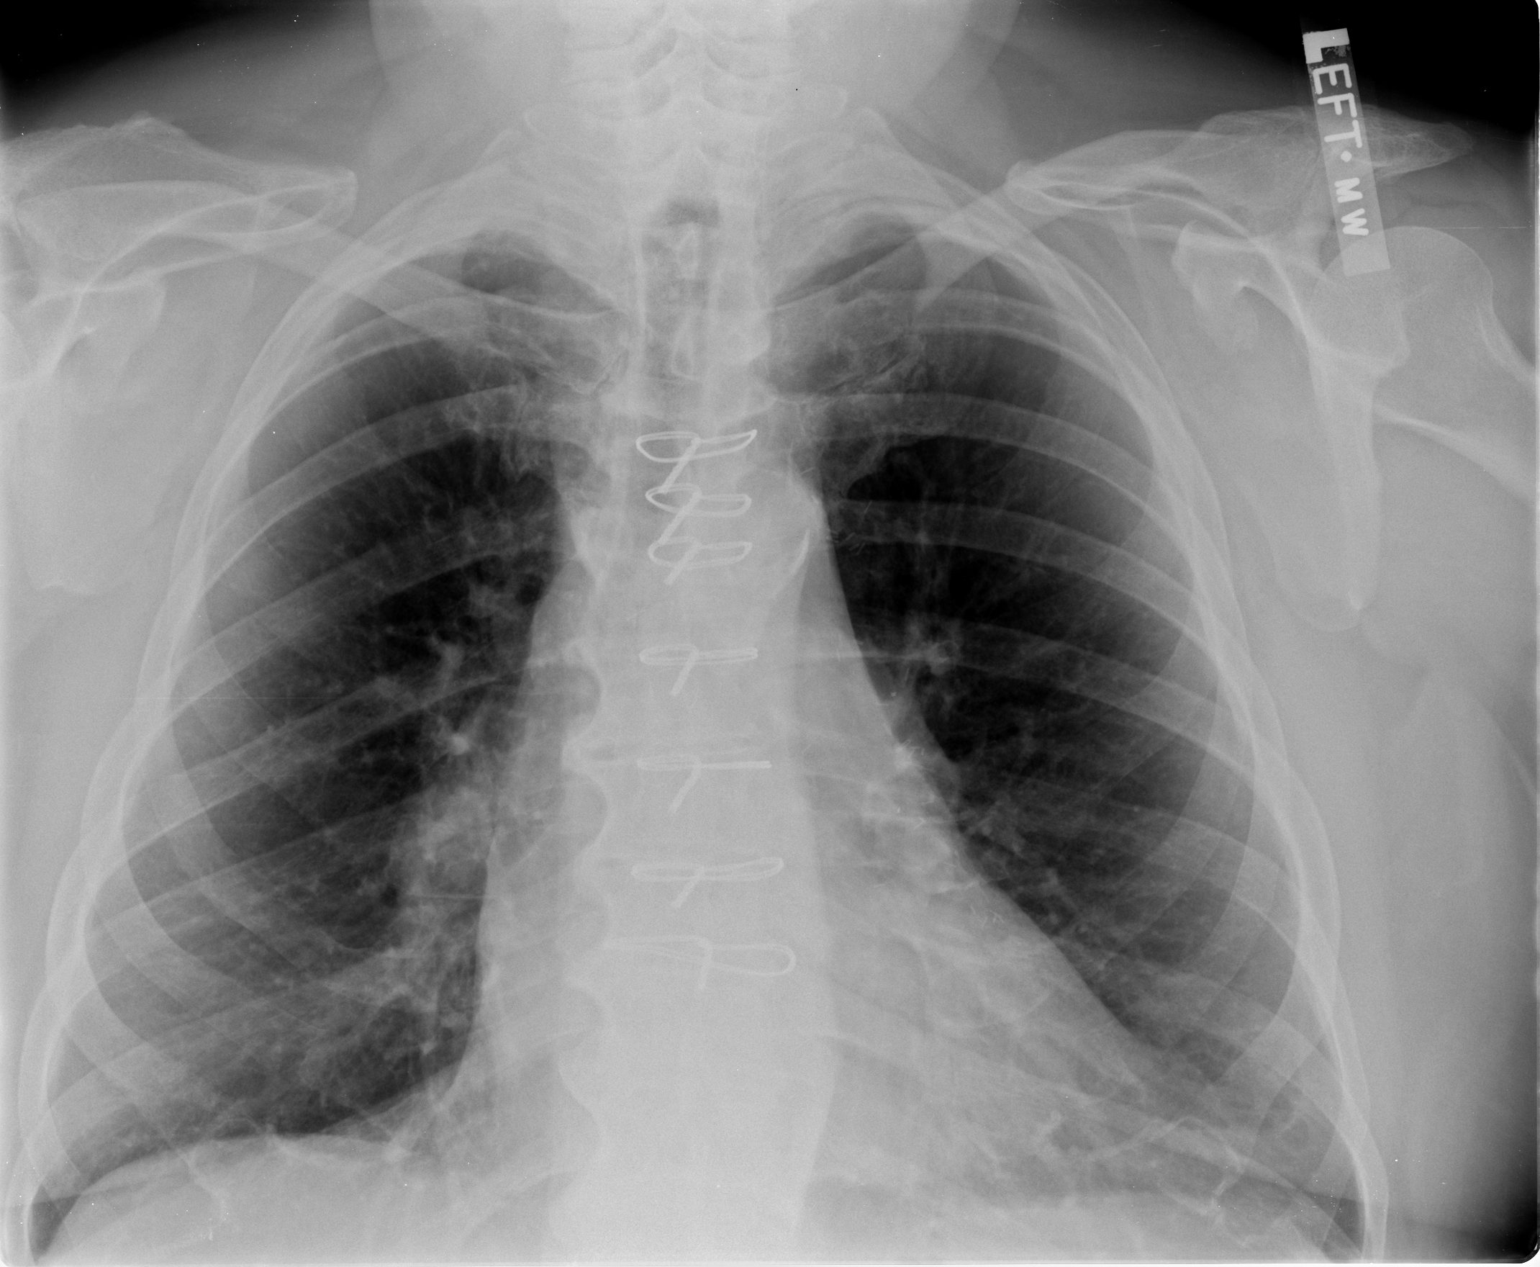

[view not recorded (2 of 2)]
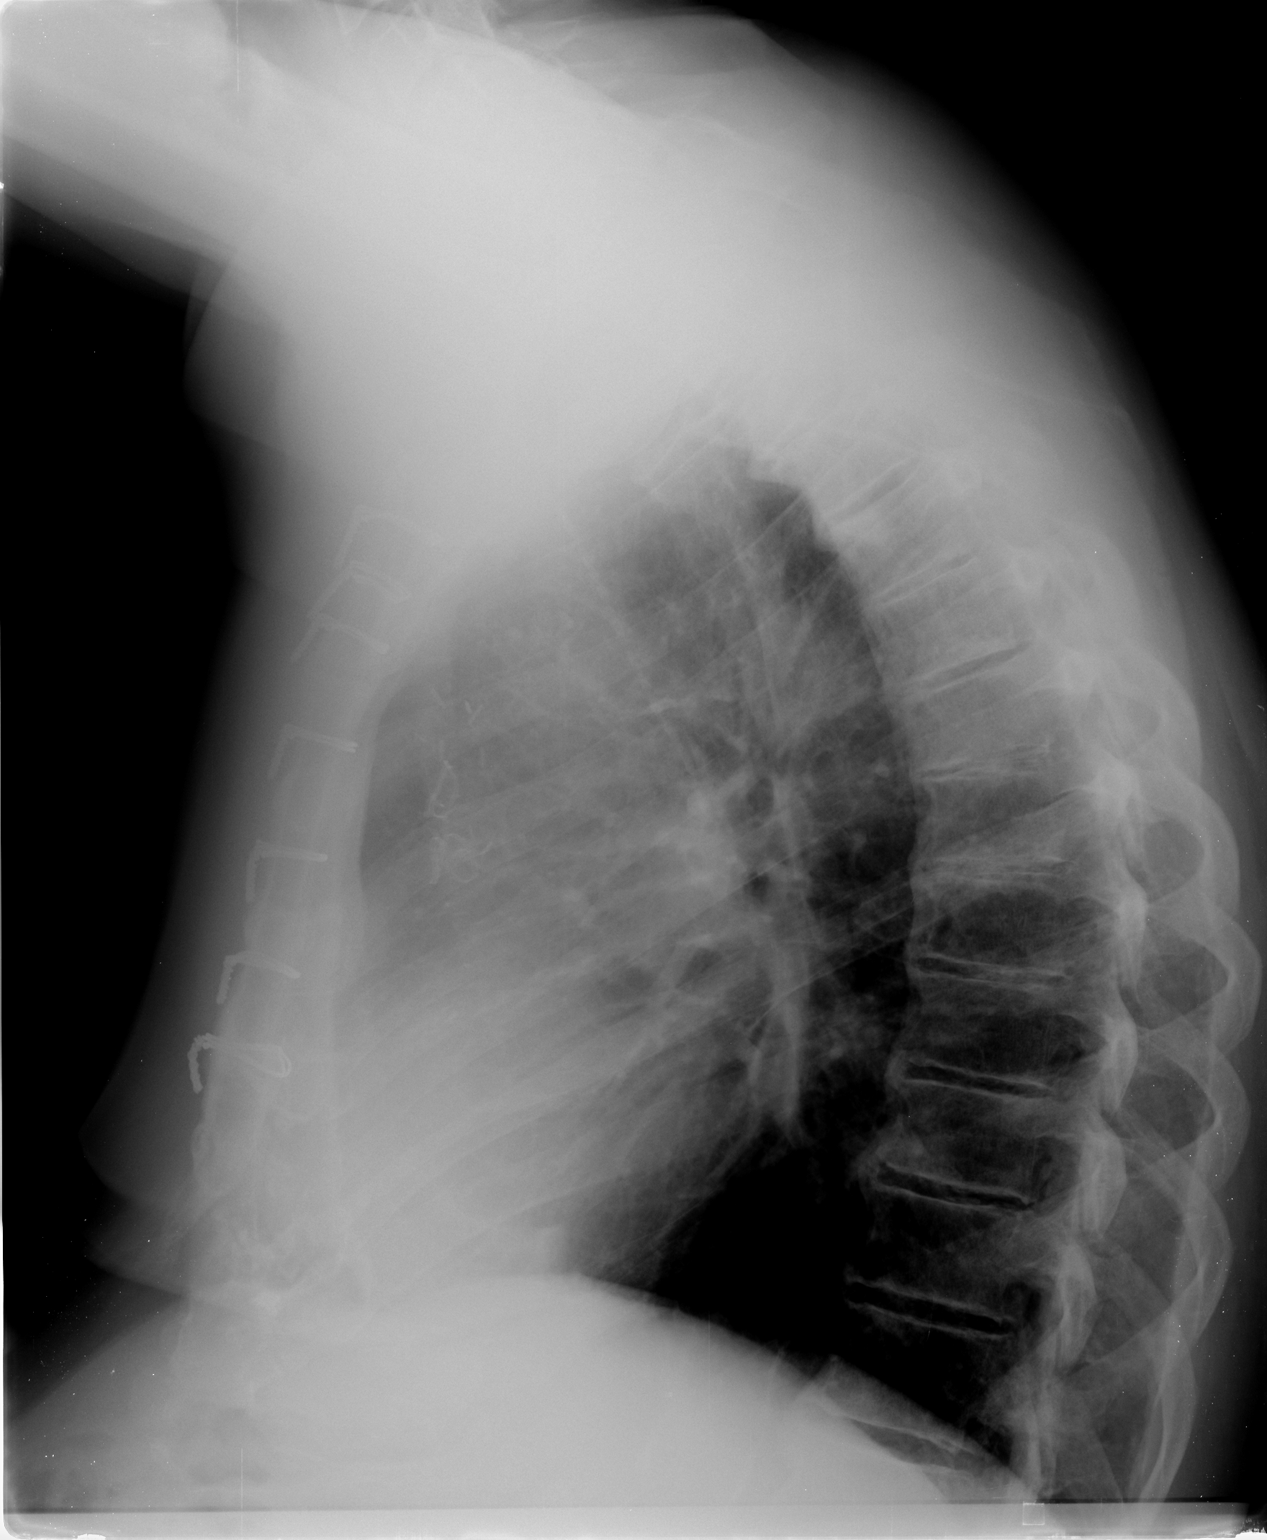

[2 of 2 positions shown; findings below may reference images not displayed]

FINDINGS: The cardiac silhouette, mediastinal and hilar contours
are stable.  There are chronic lung changes but no acute pulmonary
findings.  No pleural effusions.  The bony thorax is intact and
appears stable.
IMPRESSION: No acute cardiopulmonary findings.  Chronic lung changes and
postoperative changes

## 2013-07-26 ENCOUNTER — Other Ambulatory Visit: Payer: Self-pay | Admitting: Cardiology

## 2013-07-26 MED ORDER — EZETIMIBE 10 MG PO TABS: 10.0000 mg | ORAL_TABLET | Freq: Every day | ORAL | Status: DC

## 2013-07-26 MED ORDER — RAMIPRIL 10 MG PO CAPS: 10.0000 mg | ORAL_CAPSULE | Freq: Two times a day (BID) | ORAL | Status: DC

## 2013-07-26 MED ORDER — FUROSEMIDE 20 MG PO TABS: 20.0000 mg | ORAL_TABLET | Freq: Two times a day (BID) | ORAL | Status: DC

## 2013-07-26 MED ORDER — NEBIVOLOL HCL 10 MG PO TABS: 10.0000 mg | ORAL_TABLET | Freq: Every day | ORAL | Status: DC

## 2013-07-26 MED ORDER — AMLODIPINE BESYLATE 10 MG PO TABS: 10.0000 mg | ORAL_TABLET | Freq: Every day | ORAL | Status: DC

## 2013-07-26 MED ORDER — EZETIMIBE 10 MG PO TABS: 10.0000 mg | ORAL_TABLET | Freq: Every day | ORAL | Status: AC

## 2013-09-07 ENCOUNTER — Telehealth: Payer: Self-pay | Admitting: Cardiology

## 2013-09-07 ENCOUNTER — Other Ambulatory Visit: Payer: Self-pay | Admitting: General Surgery

## 2013-09-07 DIAGNOSIS — E785 Hyperlipidemia, unspecified: Secondary | ICD-10-CM

## 2013-09-07 DIAGNOSIS — Z79899 Other long term (current) drug therapy: Secondary | ICD-10-CM

## 2013-09-07 NOTE — Telephone Encounter (Signed)
New Problem:  Pt states  He has questions about his C-pap. Pt wants to know what he should do with his c-pap's disk now that Dr. Mayford Knife has moved. Please call pt back.  Also, Pt is scheduled for labs this Friday and there are no orders in Epic.

## 2013-09-07 NOTE — Telephone Encounter (Signed)
Spoke to pt and cleared up question about CPAP machine. Also put orders in for pts fasting Lipid and ALT panel for Friday 11/28

## 2013-09-08 ENCOUNTER — Encounter: Payer: Self-pay | Admitting: General Surgery

## 2013-09-08 DIAGNOSIS — Z79899 Other long term (current) drug therapy: Secondary | ICD-10-CM

## 2013-09-08 DIAGNOSIS — G4733 Obstructive sleep apnea (adult) (pediatric): Secondary | ICD-10-CM

## 2013-09-08 DIAGNOSIS — E669 Obesity, unspecified: Secondary | ICD-10-CM

## 2013-09-08 DIAGNOSIS — E7800 Pure hypercholesterolemia, unspecified: Secondary | ICD-10-CM

## 2013-09-08 DIAGNOSIS — I10 Essential (primary) hypertension: Secondary | ICD-10-CM

## 2013-09-08 DIAGNOSIS — I2510 Atherosclerotic heart disease of native coronary artery without angina pectoris: Secondary | ICD-10-CM

## 2013-09-10 ENCOUNTER — Telehealth: Payer: Self-pay | Admitting: General Surgery

## 2013-09-10 ENCOUNTER — Other Ambulatory Visit (INDEPENDENT_AMBULATORY_CARE_PROVIDER_SITE_OTHER): Payer: Medicare Other

## 2013-09-10 ENCOUNTER — Encounter: Payer: Self-pay | Admitting: General Surgery

## 2013-09-10 DIAGNOSIS — Z79899 Other long term (current) drug therapy: Secondary | ICD-10-CM

## 2013-09-10 DIAGNOSIS — E785 Hyperlipidemia, unspecified: Secondary | ICD-10-CM

## 2013-09-10 LAB — LIPID PANEL
HDL: 45.5 mg/dL (ref >39.00)
LDL Cholesterol: 73 mg/dL (ref 0–99)
Total CHOL/HDL Ratio: 3
Triglycerides: 98 mg/dL (ref 0.0–149.0)

## 2013-09-10 LAB — ALT: ALT: 28 U/L (ref 0–53)

## 2013-09-10 NOTE — Telephone Encounter (Signed)
Letter sent to pt to make aware. Labs have been ordered in EPIC and put on lab schedule as well.

## 2013-09-10 NOTE — Telephone Encounter (Signed)
Message copied by Nita Sells on Fri Sep 10, 2013 11:09 AM ------      Message from: Armanda Magic R      Created: Fri Sep 10, 2013 11:00 AM       Lipids at goal continue current therapy ------

## 2013-09-13 ENCOUNTER — Encounter: Payer: Self-pay | Admitting: Cardiology

## 2013-09-13 ENCOUNTER — Ambulatory Visit (INDEPENDENT_AMBULATORY_CARE_PROVIDER_SITE_OTHER): Payer: Medicare Other | Admitting: Cardiology

## 2013-09-13 VITALS — BP 186/72 | HR 47 | Ht 71.0 in | Wt 243.0 lb

## 2013-09-13 DIAGNOSIS — R0602 Shortness of breath: Secondary | ICD-10-CM

## 2013-09-13 DIAGNOSIS — E7800 Pure hypercholesterolemia, unspecified: Secondary | ICD-10-CM

## 2013-09-13 DIAGNOSIS — E669 Obesity, unspecified: Secondary | ICD-10-CM

## 2013-09-13 DIAGNOSIS — I2510 Atherosclerotic heart disease of native coronary artery without angina pectoris: Secondary | ICD-10-CM

## 2013-09-13 DIAGNOSIS — G4733 Obstructive sleep apnea (adult) (pediatric): Secondary | ICD-10-CM

## 2013-09-13 DIAGNOSIS — I10 Essential (primary) hypertension: Secondary | ICD-10-CM

## 2013-09-13 MED ORDER — ISOSORBIDE MONONITRATE ER 60 MG PO TB24: 60.0000 mg | ORAL_TABLET | Freq: Every day | ORAL | Status: AC

## 2013-09-13 NOTE — Patient Instructions (Addendum)
  Your physician has recommended you make the following change in your medication: Increase Imdur to 60 MG daily  Check your BP daily for one week and call us with the results  Your physician recommends that you schedule a follow-up appointment in: 2 Weeks with Dr. Mayford Knife

## 2013-09-13 NOTE — Progress Notes (Signed)
[address] [place], [place]  [address] Phone: [phone] Fax:  [phone]  Date:  09/13/2013   ID:  Matthew [name], DOB January 29, 1946, MRN [id number]  PCP:  Gweneth Dimitri, MD  Cardiologist:  Armanda Magic, MD     History of Present Illness: Matthew [name] is a 67 y.o. male with a history of ASCAD, HTN, OSA on CPAP, dyslipidemia and obesity who presents today for followup. He is doing well.  When I last saw him he was complaining of some chest pain and a nuclear stress test was done showing a small defect in the basal anteroseptal wall and had improved from prior scans.  We discussed cath vs.medical therapy and he opted for medical therapy.  He was started on Imdur and his symptoms significantly improved.  Recently he has noticed increasing SOB  over the past month when he walks.  He denies any  LE edema, dizziness, palpitations or syncope.  He is doing well with his CPAP.  It is set at 13cm H2O and he tolerates it well.  He has no problems with his mask and feels his pressure is adequate. He feels rested when he gets up and has no daytime sleepiness.  His download today showed an AHI of 1.3/hr on 13cm H2O and 90% compliance in using more than 4 hours nightly.  He has no significant mask leak.   Wt Readings from Last 3 Encounters:  09/13/13 243 lb (110.224 kg)  09/08/13 237 lb 3.2 oz (107.593 kg)     Past Medical History  Diagnosis Date  . Obesity   . Postoperative atrial fibrillation     with no recurrence  . Dyslipidemia   . Hypertension   . Hypothyroidism     s/p RIA for goiter  . Erectile dysfunction   . Diabetes mellitus without complication 09/2010    Type II  . OSA (obstructive sleep apnea)     on CPAP machine  . CAD (coronary artery disease)     s/p CABG with LIMA to LAD, SVG to OM, SVG to PDA, SVG to acute marginal  . Hyperlipidemia     Current Outpatient Prescriptions  Medication Sig Dispense Refill  . acetaminophen (TYLENOL ARTHRITIS PAIN) 650  MG CR tablet Take 650 mg by mouth every 8 (eight) hours as needed for pain.      Marland Kitchen amLODipine (NORVASC) 10 MG tablet Take 1 tablet (10 mg total) by mouth daily.  90 tablet  3  . aspirin 325 MG EC tablet Take 325 mg by mouth daily.      . cyanocobalamin 500 MCG tablet Take 500 mcg by mouth daily.      Marland Kitchen ezetimibe (ZETIA) 10 MG tablet Take 1 tablet (10 mg total) by mouth daily.  90 tablet  3  . furosemide (LASIX) 20 MG tablet Take 1 tablet (20 mg total) by mouth 2 (two) times daily.  180 tablet  3  . gabapentin (NEURONTIN) 100 MG capsule Take 300 mg by mouth 3 (three) times daily.      . isosorbide mononitrate (IMDUR) 30 MG 24 hr tablet Take 30 mg by mouth daily.      . lansoprazole (PREVACID) 15 MG capsule Take 15 mg by mouth daily at 12 noon. Take two capsules with evening meal      . levothyroxine (LEVOTHROID) 175 MCG tablet Take 175 mcg by mouth daily before breakfast.      . metFORMIN (GLUCOPHAGE-XR) 500 MG 24 hr tablet Take 500  mg by mouth daily with breakfast.      . Multiple Vitamins-Minerals (CENTRUM CARDIO PO) Take 2 tablets by mouth daily.      . nebivolol (BYSTOLIC) 10 MG tablet Take 1 tablet (10 mg total) by mouth daily.  90 tablet  3  . nitroGLYCERIN (NITROSTAT) 0.4 MG SL tablet Place 0.4 mg under the tongue every 5 (five) minutes as needed for chest pain.      . Omega-3 Fatty Acids (FISH OIL) 1000 MG CAPS Take 3,000 mg by mouth daily at 6 (six) AM.      . ramipril (ALTACE) 10 MG capsule Take 1 capsule (10 mg total) by mouth 2 (two) times daily.  180 capsule  3  . rosuvastatin (CRESTOR) 20 MG tablet Take 20 mg by mouth daily.      Marland Kitchen Zn-Pyg Afri-Nettle-Saw Palmet (SAW PALMETTO COMPLEX PO) Take 2 tablets by mouth daily.       No current facility-administered medications for this visit.    Allergies:    Allergies  Allergen Reactions  . Iodine     Social History:  The patient  reports that he quit smoking about 23 years ago. His smoking use included Cigarettes. He smoked 0.00  packs per day for 20 years. He has never used smokeless tobacco. He reports that he drinks alcohol. He reports that he does not use illicit drugs.   Family History:  The patient's family history is not on file.   ROS:  Please see the history of present illness.      All other systems reviewed and negative.   PHYSICAL EXAM: VS:  BP 186/72  Pulse 47  Ht 5\' 11"  (1.803 m)  Wt 243 lb (110.224 kg)  BMI 33.91 kg/m2 Well nourished, well developed, in no acute distress HEENT: normal Neck: no JVD Cardiac:  normal S1, S2; RRR; no murmur Lungs:  clear to auscultation bilaterally, no wheezing, rhonchi or rales Abd: soft, nontender, no hepatomegaly Ext: no edema Skin: warm and dry Neuro:  CNs 2-12 intact, no focal abnormalities noted  EKG:    Sinus bradycardia at 47bpm   ASSESSMENT AND PLAN:  1. ASCAD with new DOE which could be due to ischemia but also could be due to poorly controlled BP with exercise.    - continue ASA  - increase Imdur to 60mg  daily.  If his SOB does not improve we may need to repeat his heart cath. His nuclear stress test a year ago showed basal anterior ischemia in a very small territory and was low risk  2.  HTN - elevated today but at home runs 150/80's  - continue amlodipine/Bystolic/Ramipril  - Will see how his BP responds to increasing Imdur.  I have asked him to check his BP daily for a week and call with the results 2. Dyslipidemia - last LDL was at goal although HDL was low.  I encouraged him to increase his aerobic exercise which should help with the HDL.  - continue Zetia and atorvastatin 3. OSA on CPAP well tolerated - will continue current CPAP settings. 4. Obesity  - I have encouraged him to get more aerobic exercise to try to drop some weight  Followup with me in 2 weeks  Signed, Armanda Magic, MD 09/13/2013 11:03 AM

## 2013-10-01 ENCOUNTER — Encounter: Payer: Self-pay | Admitting: Cardiology

## 2013-10-01 ENCOUNTER — Ambulatory Visit (INDEPENDENT_AMBULATORY_CARE_PROVIDER_SITE_OTHER): Payer: Medicare Other | Admitting: Cardiology

## 2013-10-01 VITALS — BP 170/64 | HR 55 | Ht 71.0 in | Wt 247.0 lb

## 2013-10-01 DIAGNOSIS — E7800 Pure hypercholesterolemia, unspecified: Secondary | ICD-10-CM

## 2013-10-01 DIAGNOSIS — I10 Essential (primary) hypertension: Secondary | ICD-10-CM

## 2013-10-01 DIAGNOSIS — I2510 Atherosclerotic heart disease of native coronary artery without angina pectoris: Secondary | ICD-10-CM

## 2013-10-01 DIAGNOSIS — R0602 Shortness of breath: Secondary | ICD-10-CM

## 2013-10-01 MED ORDER — ASPIRIN 81 MG PO TBEC: 81.0000 mg | DELAYED_RELEASE_TABLET | Freq: Every day | ORAL | Status: AC

## 2013-10-01 MED ORDER — DOXAZOSIN MESYLATE 2 MG PO TABS: 2.0000 mg | ORAL_TABLET | Freq: Every day | ORAL | Status: DC

## 2013-10-01 NOTE — Patient Instructions (Signed)
Medication changes:   DECREASE: Aspirin to 81 mg daily                                      START:  Doxazosin (Cardura)  2mg  nightly at bedtime  Please check your blood pressure once a day & record it daily for 1 week.  Call our office with  Your blood pressure readings  Your physician recommends that you schedule a follow-up appointment in: 3 months with Dr. Mayford Knife

## 2013-10-01 NOTE — Progress Notes (Signed)
[address] [place], [place]  [address] Phone: [phone] Fax:  [phone]  Date:  10/01/2013   ID:  Matthew [name], DOB October 24, 1945, MRN [id number]  PCP:  Gweneth Dimitri, MD  Cardiologist:  Armanda Magic, MD     History of Present Illness: Matthew [name] is a 67 y.o. male with a history of ASCAD, HTN, OSA on CPAP, dyslipidemia and obesity who presents today for followup. He is doing well. When I last saw him he was complaining of some chest pain and a nuclear stress test was done showing a small defect in the basal anteroseptal wall and had improved from prior scans. We discussed cath vs.medical therapy and he opted for medical therapy. He was started on Imdur and his symptoms significantly improved. I saw him again recently and he has noticed increasing SOB when he walks. He denies any LE edema, dizziness, palpitations or syncope. At his last OV I increased his Imdur to 60mg  daily.  He says that today he is doing well.  After increasing the Imdur he has not had any chest pain and DOE has resolved.  He is able to to activities without symptoms now.  He brings in his BP readings today which range from 139-176/56-34mmHg.   Wt Readings from Last 3 Encounters:  09/13/13 243 lb (110.224 kg)  09/08/13 237 lb 3.2 oz (107.593 kg)     Past Medical History  Diagnosis Date  . Obesity   . Postoperative atrial fibrillation     with no recurrence  . Dyslipidemia   . Hypertension   . Hypothyroidism     s/p RIA for goiter  . Erectile dysfunction   . Diabetes mellitus without complication 09/2010    Type II  . OSA (obstructive sleep apnea)     on CPAP machine  . CAD (coronary artery disease)     s/p CABG with LIMA to LAD, SVG to OM, SVG to PDA, SVG to acute marginal  . Hyperlipidemia     Current Outpatient Prescriptions  Medication Sig Dispense Refill  . acetaminophen (TYLENOL ARTHRITIS PAIN) 650 MG CR tablet Take 650 mg by mouth every 8 (eight) hours as needed for  pain.      Marland Kitchen amLODipine (NORVASC) 10 MG tablet Take 1 tablet (10 mg total) by mouth daily.  90 tablet  3  . aspirin 325 MG EC tablet Take 325 mg by mouth daily.      . cyanocobalamin 500 MCG tablet Take 500 mcg by mouth daily.      Marland Kitchen ezetimibe (ZETIA) 10 MG tablet Take 1 tablet (10 mg total) by mouth daily.  90 tablet  3  . furosemide (LASIX) 20 MG tablet Take 1 tablet (20 mg total) by mouth 2 (two) times daily.  180 tablet  3  . gabapentin (NEURONTIN) 100 MG capsule Take 300 mg by mouth 3 (three) times daily.      . isosorbide mononitrate (IMDUR) 60 MG 24 hr tablet Take 1 tablet (60 mg total) by mouth daily.  90 tablet  3  . lansoprazole (PREVACID) 15 MG capsule Take 15 mg by mouth daily at 12 noon. Take two capsules with evening meal      . levothyroxine (LEVOTHROID) 175 MCG tablet Take 175 mcg by mouth daily before breakfast.      . metFORMIN (GLUCOPHAGE-XR) 500 MG 24 hr tablet Take 500 mg by mouth daily with breakfast.      . Multiple Vitamins-Minerals (CENTRUM CARDIO PO) Take 2  tablets by mouth daily.      . nebivolol (BYSTOLIC) 10 MG tablet Take 1 tablet (10 mg total) by mouth daily.  90 tablet  3  . nitroGLYCERIN (NITROSTAT) 0.4 MG SL tablet Place 0.4 mg under the tongue every 5 (five) minutes as needed for chest pain.      . Omega-3 Fatty Acids (FISH OIL) 1000 MG CAPS Take 3,000 mg by mouth daily at 6 (six) AM.      . ramipril (ALTACE) 10 MG capsule Take 1 capsule (10 mg total) by mouth 2 (two) times daily.  180 capsule  3  . rosuvastatin (CRESTOR) 20 MG tablet Take 20 mg by mouth daily.      Marland Kitchen Zn-Pyg Afri-Nettle-Saw Palmet (SAW PALMETTO COMPLEX PO) Take 2 tablets by mouth daily.       No current facility-administered medications for this visit.    Allergies:    Allergies  Allergen Reactions  . Iodine     Social History:  The patient  reports that he quit smoking about 23 years ago. His smoking use included Cigarettes. He smoked 0.00 packs per day for 20 years. He has never used  smokeless tobacco. He reports that he drinks alcohol. He reports that he does not use illicit drugs.   Family History:  The patient's family history is not on file.   ROS:  Please see the history of present illness.      All other systems reviewed and negative.   PHYSICAL EXAM: VS:  There were no vitals taken for this visit. Well nourished, well developed, in no acute distress HEENT: normal Neck: no JVD Cardiac:  normal S1, S2; RRR; no murmur Lungs:  clear to auscultation bilaterally, no wheezing, rhonchi or rales Abd: soft, nontender, no hepatomegaly Ext: 1+  edema Skin: warm and dry Neuro:  CNs 2-12 intact, no focal abnormalities noted      ASSESSMENT AND PLAN:  1. ASCAD with no angina  - continue ASA and decrease to 81mg  daily/Imdur 2. HTN - BP elevated today  - continue amlodipine/bystolic/ramipril  - start doxazosin 2mg  qhs  - I have asked him to check his BP daily for a week and call 3. Dyslipidemia  - continue Crestor/Zetia  Followup with me in 3 months    Signed, Armanda Magic, MD 10/01/2013 3:39 PM

## 2013-10-19 ENCOUNTER — Encounter: Payer: Self-pay | Admitting: Cardiology

## 2013-10-30 ENCOUNTER — Other Ambulatory Visit: Payer: Self-pay | Admitting: *Deleted

## 2013-10-30 DIAGNOSIS — Z79899 Other long term (current) drug therapy: Secondary | ICD-10-CM

## 2013-10-30 DIAGNOSIS — E785 Hyperlipidemia, unspecified: Secondary | ICD-10-CM

## 2013-11-05 ENCOUNTER — Other Ambulatory Visit: Payer: Medicare Other

## 2014-01-04 ENCOUNTER — Ambulatory Visit: Payer: Medicare Other | Admitting: Cardiology

## 2014-01-14 ENCOUNTER — Ambulatory Visit (INDEPENDENT_AMBULATORY_CARE_PROVIDER_SITE_OTHER): Payer: Medicare Other | Admitting: *Deleted

## 2014-01-14 DIAGNOSIS — E7800 Pure hypercholesterolemia, unspecified: Secondary | ICD-10-CM

## 2014-01-14 LAB — LIPID PANEL
Cholesterol: 117 mg/dL (ref 0–200)
HDL: 37 mg/dL — ABNORMAL LOW (ref >39)
LDL CALC: 53 mg/dL (ref 0–99)
Total CHOL/HDL Ratio: 3.2 Ratio
Triglycerides: 136 mg/dL (ref <150)
VLDL: 27 mg/dL (ref 0–40)

## 2014-01-14 LAB — ALT: ALT: 26 U/L (ref 0–53)

## 2014-01-18 ENCOUNTER — Encounter: Payer: Self-pay | Admitting: General Surgery

## 2014-01-18 ENCOUNTER — Encounter: Payer: Self-pay | Admitting: Cardiology

## 2014-01-18 ENCOUNTER — Ambulatory Visit (INDEPENDENT_AMBULATORY_CARE_PROVIDER_SITE_OTHER): Payer: Medicare Other | Admitting: Cardiology

## 2014-01-18 ENCOUNTER — Other Ambulatory Visit: Payer: Self-pay | Admitting: General Surgery

## 2014-01-18 VITALS — BP 160/70 | HR 60 | Ht 70.0 in | Wt 258.0 lb

## 2014-01-18 DIAGNOSIS — G4733 Obstructive sleep apnea (adult) (pediatric): Secondary | ICD-10-CM

## 2014-01-18 DIAGNOSIS — E7800 Pure hypercholesterolemia, unspecified: Secondary | ICD-10-CM

## 2014-01-18 DIAGNOSIS — R0602 Shortness of breath: Secondary | ICD-10-CM

## 2014-01-18 DIAGNOSIS — I10 Essential (primary) hypertension: Secondary | ICD-10-CM

## 2014-01-18 DIAGNOSIS — E669 Obesity, unspecified: Secondary | ICD-10-CM

## 2014-01-18 DIAGNOSIS — I2510 Atherosclerotic heart disease of native coronary artery without angina pectoris: Secondary | ICD-10-CM

## 2014-01-18 MED ORDER — DOXAZOSIN MESYLATE 4 MG PO TABS: 2.0000 mg | ORAL_TABLET | Freq: Every day | ORAL | Status: DC

## 2014-01-18 NOTE — Patient Instructions (Signed)
Your physician has recommended you make the following change in your medication: 1. Increase  Doxazosin to 4 MG daily at bedtime  Your physician has requested that you regularly monitor and record your blood pressure readings at home. Please use the same machine at the same time of day to check your readings and record them for one week and call us with the results  Your physician recommends that you schedule a follow-up appointment in: 3 months with Dr Mayford Knifeurner

## 2014-01-18 NOTE — Progress Notes (Signed)
[address] [place], [place]  [address] Phone: [phone] Fax:  [phone]  Date:  01/18/2014   ID:  Matthew [name], DOB 04/16/1946, MRN [id number]  PCP:  Matthew DimitriMCNEILL,WENDY, MD  Cardiologist:  Matthew Magicraci [name], MD   HPI:  Matthew [name] is a 68 y.o. male with a history of ASCAD, HTN, OSA on CPAP, dyslipidemia and obesity who presents today for followup. He is doing well. When I last saw him he was complaining of some chest pain and a nuclear stress test was done showing a small defect in the basal anteroseptal wall and had improved from prior scans. We discussed cath vs.medical therapy and he opted for medical therapy. He was started on Imdur and his symptoms significantly improved. I saw him back and he had noticed increasing SOB when he walked. He denied any LE edema, dizziness, palpitations or syncope. At his last OV I increased his Imdur to 60mg  daily. After increasing the Imdur he has not had any chest pain and DOE has improved but he still gets some DOE with some activities. He is able to to activities without symptoms now. He is tolerating his CPAP device. He tolerates the mask and feels the pressure is adequate.  His download today showed an AHI of 0.7/hr on 13cm H2O and 92% compliance in using more than 4 hours nightly.   Wt Readings from Last 3 Encounters:  01/18/14 258 lb (117.028 kg)  10/01/13 247 lb (112.038 kg)  09/13/13 243 lb (110.224 kg)     Past Medical History  Diagnosis Date  . Obesity   . Postoperative atrial fibrillation     with no recurrence  . Dyslipidemia   . Hypertension   . Hypothyroidism     s/p RIA for goiter  . Erectile dysfunction   . Diabetes mellitus without complication 09/2010    Type II  . OSA (obstructive sleep apnea)     on CPAP machine  . CAD (coronary artery disease)     s/p CABG with LIMA to LAD, SVG to OM, SVG to PDA, SVG to acute marginal  . Hyperlipidemia     Current Outpatient Prescriptions  Medication Sig Dispense  Refill  . acetaminophen (TYLENOL ARTHRITIS PAIN) 650 MG CR tablet Take 650 mg by mouth every 8 (eight) hours as needed for pain.      Marland Kitchen. amLODipine (NORVASC) 10 MG tablet Take 1 tablet (10 mg total) by mouth daily.  90 tablet  3  . aspirin 81 MG EC tablet Take 1 tablet (81 mg total) by mouth daily.      Marland Kitchen. atorvastatin (LIPITOR) 40 MG tablet       . cyanocobalamin 500 MCG tablet Take 500 mcg by mouth daily.      Marland Kitchen. doxazosin (CARDURA) 2 MG tablet Take 1 tablet (2 mg total) by mouth daily.  30 tablet  6  . ezetimibe (ZETIA) 10 MG tablet Take 1 tablet (10 mg total) by mouth daily.  90 tablet  3  . furosemide (LASIX) 20 MG tablet Take 1 tablet (20 mg total) by mouth 2 (two) times daily.  180 tablet  3  . gabapentin (NEURONTIN) 100 MG capsule Take 300 mg by mouth 3 (three) times daily.      . isosorbide mononitrate (IMDUR) 60 MG 24 hr tablet Take 1 tablet (60 mg total) by mouth daily.  90 tablet  3  . lansoprazole (PREVACID) 15 MG capsule Take 15 mg by mouth daily at 12 noon.  Take two capsules with evening meal      . levothyroxine (LEVOTHROID) 175 MCG tablet Take 175 mcg by mouth daily before breakfast.      . metFORMIN (GLUCOPHAGE-XR) 500 MG 24 hr tablet Take 500 mg by mouth daily with breakfast.      . Multiple Vitamins-Minerals (CENTRUM CARDIO PO) Take 2 tablets by mouth as needed.       . nebivolol (BYSTOLIC) 10 MG tablet Take 5 mg by mouth daily.      . nitroGLYCERIN (NITROSTAT) 0.4 MG SL tablet Place 0.4 mg under the tongue every 5 (five) minutes as needed for chest pain.      . Omega-3 Fatty Acids (FISH OIL) 1000 MG CAPS Take 3,000 mg by mouth daily at 6 (six) AM.      . ramipril (ALTACE) 10 MG capsule Take 1 capsule (10 mg total) by mouth 2 (two) times daily.  180 capsule  3  . Zn-Pyg Afri-Nettle-Saw Palmet (SAW PALMETTO COMPLEX PO) Take 2 tablets by mouth daily.       No current facility-administered medications for this visit.    Allergies:    Allergies  Allergen Reactions  . Iodine       Social History:  The patient  reports that he quit smoking about 24 years ago. His smoking use included Cigarettes. He smoked 0.00 packs per day for 20 years. He has never used smokeless tobacco. He reports that he drinks alcohol. He reports that he does not use illicit drugs.   Family History:  The patient's family history is not on file.   ROS:  Please see the history of present illness.      All other systems reviewed and negative.   PHYSICAL EXAM: VS:  BP 160/70  Pulse 60  Ht 5\' 10"  (1.778 m)  Wt 258 lb (117.028 kg)  BMI 37.02 kg/m2 Well nourished, well developed, in no acute distress HEENT: normal Neck: no JVD Cardiac:  normal S1, S2; RRR; no murmur Lungs:  clear to auscultation bilaterally, no wheezing, rhonchi or rales Abd: soft, nontender, no hepatomegaly Ext: no edema Skin: warm and dry Neuro:  CNs 2-12 intact, no focal abnormalities noted  ASSESSMENT AND PLAN:  1. ASCAD with no anginal chest pain but still has some DOE - continue ASA /Imdur  2. HTN - BP elevated today - continue amlodipine/bystolic/ramipril/doxazosin  - increase Doxazosin to 4mg  qhs - I have asked him to check his BP daily for a week and call  3. Dyslipidemia LDL 53 when checked last week - continue Atorvastatin/Zetia       4.  OSA on CPAP and tolerating well.  Continue current CPAP settings       5.  DOE with low risk nuclear stress test.  I think that most of his problems with DOE are related to obesity.  He has a very large abdomen which restricts his lung capacity.  Also his BP is not controlled which is probably contributing to his DOE.   Followup with me in 3 months     Signed, Matthew Magic, MD 01/18/2014 8:35 AM

## 2014-02-02 ENCOUNTER — Telehealth: Payer: Self-pay | Admitting: Cardiology

## 2014-02-02 NOTE — Telephone Encounter (Signed)
To Dr Turner to advise 

## 2014-02-02 NOTE — Telephone Encounter (Signed)
Please increase Doxazosin to 4mg  1 tablet daily and check BP daily for a week and call with results

## 2014-02-02 NOTE — Telephone Encounter (Signed)
New message    bp readings 4-9 150/62; 4-10 152/70; 4-11 156/70; 4-12 167/68; 4-13 148/63; 4-14 145/65; 4-15 150/64

## 2014-02-03 NOTE — Telephone Encounter (Signed)
Called Pt and phone keeps ringing. Unable to LVM for pt to return call. Will try again later.

## 2014-02-07 ENCOUNTER — Encounter: Payer: Self-pay | Admitting: Cardiology

## 2014-02-08 ENCOUNTER — Other Ambulatory Visit: Payer: Self-pay | Admitting: General Surgery

## 2014-02-08 ENCOUNTER — Other Ambulatory Visit: Payer: Self-pay

## 2014-02-08 MED ORDER — DOXAZOSIN MESYLATE 4 MG PO TABS: 4.0000 mg | ORAL_TABLET | Freq: Every day | ORAL | Status: DC

## 2014-02-08 NOTE — Telephone Encounter (Signed)
Pt is aware.  

## 2014-02-08 NOTE — Telephone Encounter (Signed)
Pt is aware and med list updated.  

## 2014-03-10 ENCOUNTER — Other Ambulatory Visit: Payer: Medicare Other

## 2014-03-21 ENCOUNTER — Other Ambulatory Visit: Payer: Self-pay

## 2014-03-21 MED ORDER — ATORVASTATIN CALCIUM 40 MG PO TABS: 40.0000 mg | ORAL_TABLET | Freq: Every day | ORAL | Status: DC

## 2014-03-21 MED ORDER — NEBIVOLOL HCL 5 MG PO TABS: 5.0000 mg | ORAL_TABLET | Freq: Every day | ORAL | Status: DC

## 2014-05-24 ENCOUNTER — Ambulatory Visit (INDEPENDENT_AMBULATORY_CARE_PROVIDER_SITE_OTHER): Payer: Medicare Other | Admitting: Cardiology

## 2014-05-24 ENCOUNTER — Encounter: Payer: Self-pay | Admitting: Cardiology

## 2014-05-24 VITALS — BP 120/78 | HR 56 | Ht 71.0 in | Wt 247.0 lb

## 2014-05-24 DIAGNOSIS — I2510 Atherosclerotic heart disease of native coronary artery without angina pectoris: Secondary | ICD-10-CM

## 2014-05-24 DIAGNOSIS — I10 Essential (primary) hypertension: Secondary | ICD-10-CM

## 2014-05-24 DIAGNOSIS — R0602 Shortness of breath: Secondary | ICD-10-CM

## 2014-05-24 DIAGNOSIS — G4733 Obstructive sleep apnea (adult) (pediatric): Secondary | ICD-10-CM

## 2014-05-24 DIAGNOSIS — E7800 Pure hypercholesterolemia, unspecified: Secondary | ICD-10-CM

## 2014-05-24 NOTE — Progress Notes (Signed)
[address] [place], [place]  [address] Phone: [phone] Fax:  [phone]) [phone]  Date:  05/24/2014   ID:  Matthew [name], DOB 02/28/1946, MRN [id number]  PCP:  Gweneth DimitriMCNEILL,WENDY, MD  Cardiologist:  Armanda Magicraci Turner, MD    History of Present Illness: Matthew [name] is a 68 y.o. male with a history of ASCAD, HTN, OSA on CPAP, dyslipidemia and obesity who presents today for followup. He is doing well. He is tolerating his CPAP device. He tolerates the mask and feels the pressure is adequate.  He denies any chest pain, SOB, DOE, LE edema, dizziness, palpitations or syncope.  He has been having a lot of back pain and is seeing Dr. Yetta BarreJones with Neurosurgery.  He is having a lot of problems walking due to back pain and then gets SOB.  He denies any anginal chest pain, LE edema, palpitations, dizziness, or syncope.   Wt Readings from Last 3 Encounters:  05/24/14 247 lb (112.038 kg)  01/18/14 258 lb (117.028 kg)  10/01/13 247 lb (112.038 kg)     Past Medical History  Diagnosis Date  . Obesity   . Postoperative atrial fibrillation     with no recurrence  . Dyslipidemia   . Hypertension   . Hypothyroidism     s/p RIA for goiter  . Erectile dysfunction   . Diabetes mellitus without complication 09/2010    Type II  . OSA (obstructive sleep apnea)     on CPAP machine  . CAD (coronary artery disease)     s/p CABG with LIMA to LAD, SVG to OM, SVG to PDA, SVG to acute marginal  . Hyperlipidemia     Current Outpatient Prescriptions  Medication Sig Dispense Refill  . acetaminophen (TYLENOL ARTHRITIS PAIN) 650 MG CR tablet Take 650 mg by mouth every 8 (eight) hours as needed for pain.      Marland Kitchen. amLODipine (NORVASC) 10 MG tablet Take 1 tablet (10 mg total) by mouth daily.  90 tablet  3  . aspirin 81 MG EC tablet Take 1 tablet (81 mg total) by mouth daily.      Marland Kitchen. atorvastatin (LIPITOR) 40 MG tablet Take 1 tablet (40 mg total) by mouth daily at 6 PM.  90 tablet  1  . cyanocobalamin 500  MCG tablet Take 500 mcg by mouth daily.      Marland Kitchen. doxazosin (CARDURA) 4 MG tablet Take 1 tablet (4 mg total) by mouth daily.  30 tablet  6  . ezetimibe (ZETIA) 10 MG tablet Take 1 tablet (10 mg total) by mouth daily.  90 tablet  3  . furosemide (LASIX) 20 MG tablet Take 1 tablet (20 mg total) by mouth 2 (two) times daily.  180 tablet  3  . gabapentin (NEURONTIN) 100 MG capsule Take 300 mg by mouth 3 (three) times daily.      . isosorbide mononitrate (IMDUR) 60 MG 24 hr tablet Take 1 tablet (60 mg total) by mouth daily.  90 tablet  3  . lansoprazole (PREVACID) 15 MG capsule Take 15 mg by mouth daily at 12 noon. Take two capsules with evening meal      . levothyroxine (LEVOTHROID) 175 MCG tablet Take 175 mcg by mouth daily before breakfast.      . metFORMIN (GLUCOPHAGE-XR) 500 MG 24 hr tablet Take 500 mg by mouth daily with breakfast.      . Multiple Vitamins-Minerals (CENTRUM CARDIO PO) Take 2 tablets by mouth as needed.       .Marland Kitchen  nebivolol (BYSTOLIC) 5 MG tablet Take 1 tablet (5 mg total) by mouth daily.  90 tablet  1  . nitroGLYCERIN (NITROSTAT) 0.4 MG SL tablet Place 0.4 mg under the tongue every 5 (five) minutes as needed for chest pain.      . Omega-3 Fatty Acids (FISH OIL) 1000 MG CAPS Take 3,000 mg by mouth daily at 6 (six) AM.      . ramipril (ALTACE) 10 MG capsule Take 1 capsule (10 mg total) by mouth 2 (two) times daily.  180 capsule  3  . Zn-Pyg Afri-Nettle-Saw Palmet (SAW PALMETTO COMPLEX PO) Take 2 tablets by mouth daily.       No current facility-administered medications for this visit.    Allergies:    Allergies  Allergen Reactions  . Iodine     Social History:  The patient  reports that he quit smoking about 24 years ago. His smoking use included Cigarettes. He smoked 0.00 packs per day for 20 years. He has never used smokeless tobacco. He reports that he drinks alcohol. He reports that he does not use illicit drugs.   Family History:  The patient's family history is not on file.    ROS:  Please see the history of present illness.      All other systems reviewed and negative.   PHYSICAL EXAM: VS:  BP 120/78  Pulse 56  Ht 5\' 11"  (1.803 m)  Wt 247 lb (112.038 kg)  BMI 34.46 kg/m2  SpO2 97% Well nourished, well developed, in no acute distress HEENT: normal Neck: no JVD Cardiac:  normal S1, S2; RRR; no murmur Lungs:  clear to auscultation bilaterally, no wheezing, rhonchi or rales Abd: soft, nontender, no hepatomegaly Ext: no edema Skin: warm and dry Neuro:  CNs 2-12 intact, no focal abnormalities noted   ASSESSMENT AND PLAN:  1.  ASCAD with no anginal chest pain but still has some DOE - continue ASA /Imdur  2.  HTN - BP elevated today - continue amlodipine/bystolic/ramipril/doxazosin  3.  Dyslipidemia LDL 53 - continue Atorvastatin/Zetia  4. OSA on CPAP and tolerating well. Continue current CPAP settings.  He will take it to [facility] to get a download. 5. DOE with low risk nuclear stress test. I think that most of his problems with DOE are related to obesity. He has a very large abdomen which restricts his lung capacity.  He is very concerned though that his SOB may be due to his CAD.  I will repeat a Lexiscan myoview to rule out ischemia.  It has been 2 years since his last one.  Check a BNP/BMET.  Check 2D echo. 6.  Chronic LE edema - continue Lasix  Followup with me in 6 months    Signed, Armanda Magic, MD 05/24/2014 4:23 PM

## 2014-05-24 NOTE — Patient Instructions (Signed)
Your physician recommends that you continue on your current medications as directed. Please refer to the Current Medication list given to you today.  Your physician recommends that you go to the lab today for a BNP and BMET  Your physician has requested that you have a lexiscan myoview. For further information please visit [email or web address]. Please follow instruction sheet, as given.  Your physician has requested that you have an echocardiogram. Echocardiography is a painless test that uses sound waves to create images of your heart. It provides your doctor with information about the size and shape of your heart and how well your heart's chambers and valves are working. This procedure takes approximately one hour. There are no restrictions for this procedure.

## 2014-05-25 ENCOUNTER — Other Ambulatory Visit: Payer: Self-pay | Admitting: General Surgery

## 2014-05-25 DIAGNOSIS — Z79899 Other long term (current) drug therapy: Secondary | ICD-10-CM

## 2014-05-25 LAB — BRAIN NATRIURETIC PEPTIDE: PRO B NATRI PEPTIDE: 142 pg/mL — AB (ref 0.0–100.0)

## 2014-05-25 LAB — BASIC METABOLIC PANEL
BUN: 23 mg/dL (ref 6–23)
CHLORIDE: 104 meq/L (ref 96–112)
CO2: 28 meq/L (ref 19–32)
CREATININE: 1.8 mg/dL — AB (ref 0.4–1.5)
Calcium: 9.1 mg/dL (ref 8.4–10.5)
GFR: 39.52 mL/min — ABNORMAL LOW (ref >60.00)
Glucose, Bld: 97 mg/dL (ref 70–99)
POTASSIUM: 4.2 meq/L (ref 3.5–5.1)
Sodium: 140 mEq/L (ref 135–145)

## 2014-05-25 MED ORDER — FUROSEMIDE 20 MG PO TABS: ORAL_TABLET | ORAL | Status: DC

## 2014-06-03 ENCOUNTER — Ambulatory Visit (INDEPENDENT_AMBULATORY_CARE_PROVIDER_SITE_OTHER): Payer: Medicare Other | Admitting: *Deleted

## 2014-06-03 DIAGNOSIS — Z79899 Other long term (current) drug therapy: Secondary | ICD-10-CM

## 2014-06-04 LAB — BASIC METABOLIC PANEL
BUN: 20 mg/dL (ref 6–23)
CALCIUM: 8.5 mg/dL (ref 8.4–10.5)
CHLORIDE: 105 meq/L (ref 96–112)
CO2: 26 meq/L (ref 19–32)
Creatinine, Ser: 1.6 mg/dL — ABNORMAL HIGH (ref 0.4–1.5)
GFR: 46.23 mL/min — ABNORMAL LOW (ref >60.00)
GLUCOSE: 140 mg/dL — AB (ref 70–99)
Potassium: 3.9 mEq/L (ref 3.5–5.1)
SODIUM: 140 meq/L (ref 135–145)

## 2014-06-06 MED ORDER — FUROSEMIDE 20 MG PO TABS: 20.0000 mg | ORAL_TABLET | Freq: Every day | ORAL | Status: DC

## 2014-06-07 ENCOUNTER — Ambulatory Visit (HOSPITAL_BASED_OUTPATIENT_CLINIC_OR_DEPARTMENT_OTHER): Payer: Medicare Other | Admitting: Cardiology

## 2014-06-07 ENCOUNTER — Ambulatory Visit (HOSPITAL_COMMUNITY): Payer: Medicare Other | Attending: Cardiology | Admitting: Radiology

## 2014-06-07 VITALS — BP 166/54 | HR 51 | Ht 71.0 in | Wt 247.0 lb

## 2014-06-07 DIAGNOSIS — I2510 Atherosclerotic heart disease of native coronary artery without angina pectoris: Secondary | ICD-10-CM | POA: Diagnosis not present

## 2014-06-07 DIAGNOSIS — R0602 Shortness of breath: Secondary | ICD-10-CM

## 2014-06-07 DIAGNOSIS — I4891 Unspecified atrial fibrillation: Secondary | ICD-10-CM | POA: Insufficient documentation

## 2014-06-07 DIAGNOSIS — Z951 Presence of aortocoronary bypass graft: Secondary | ICD-10-CM | POA: Diagnosis not present

## 2014-06-07 DIAGNOSIS — R079 Chest pain, unspecified: Secondary | ICD-10-CM | POA: Insufficient documentation

## 2014-06-07 DIAGNOSIS — R0989 Other specified symptoms and signs involving the circulatory and respiratory systems: Secondary | ICD-10-CM | POA: Insufficient documentation

## 2014-06-07 DIAGNOSIS — Z8249 Family history of ischemic heart disease and other diseases of the circulatory system: Secondary | ICD-10-CM | POA: Diagnosis not present

## 2014-06-07 DIAGNOSIS — R0609 Other forms of dyspnea: Secondary | ICD-10-CM | POA: Insufficient documentation

## 2014-06-07 DIAGNOSIS — I779 Disorder of arteries and arterioles, unspecified: Secondary | ICD-10-CM | POA: Diagnosis not present

## 2014-06-07 MED ORDER — REGADENOSON 0.4 MG/5ML IV SOLN
0.4000 mg | Freq: Once | INTRAVENOUS | Status: AC
Start: 2014-06-07 — End: 2014-06-07
  Administered 2014-06-07: 0.4 mg via INTRAVENOUS

## 2014-06-07 MED ORDER — TECHNETIUM TC 99M SESTAMIBI GENERIC - CARDIOLITE
10.0000 | Freq: Once | INTRAVENOUS | Status: AC | PRN
  Administered 2014-06-07: 10 via INTRAVENOUS

## 2014-06-07 MED ORDER — TECHNETIUM TC 99M SESTAMIBI GENERIC - CARDIOLITE
30.0000 | Freq: Once | INTRAVENOUS | Status: AC | PRN
  Administered 2014-06-07: 30 via INTRAVENOUS

## 2014-06-07 NOTE — Progress Notes (Signed)
[facility] SITE 3 NUCLEAR MED [address] [place], [place] [address] [phone]    Cardiology Nuclear Med Study  Matthew [name] is a 68 y.o. male     MRN : [id number]     DOB: 1946-03-17  Procedure Date: 06/07/2014  Nuclear Med Background Indication for Stress Test:  Evaluation for Ischemia and Graft Patency History:  CAD-CATH-CABG x4 H/O AFIB MPI: 07/08/12 Abnormal EF: 58% mild ischemia Cardiac Risk Factors: Carotid Disease, Family History - CAD, History of Smoking, Hypertension and NIDDM  Symptoms:  Chest Pain, DOE and SOB   Nuclear Pre-Procedure Caffeine/Decaff Intake:  None NPO After: 8:30pm   Lungs:  clear O2 Sat: 96% on room air. IV 0.9% NS with Angio Cath:  22g  IV Site: R Hand  IV Started by:  Milana Na, EMT-P  Chest Size (in):  48 Cup Size: n/a  Height:  (1.803 m)  Weight:  247 lb (112.038 kg)  BMI:  Body mass index is 34.46 kg/(m^2). Tech Comments:  Rx this am CBG 110 mg/dl 2:13     Nuclear Med Study 1 or 2 day study: 1 day  Stress Test Type:  Eugenie Birks  Reading MD: n/a  Order Authorizing Provider:  T.Turner MD  Resting Radionuclide: Technetium 9m Sestamibi  Resting Radionuclide Dose: 11.0 mCi   Stress Radionuclide:  Technetium 85m Sestamibi  Stress Radionuclide Dose: 33.0 mCi           Stress Protocol Rest HR: 51 Stress HR: 60  Rest BP: 166/54 Stress BP: 152/58  Exercise Time (min): n/a METS: n/a           Dose of Adenosine (mg):  n/a Dose of Lexiscan: 0.4 mg  Dose of Atropine (mg): n/a Dose of Dobutamine: n/a mcg/kg/min (at max HR)  Stress Test Technologist: Nelson Chimes, BS-ES  Nuclear Technologist:  Doyne Keel, CNMT     Rest Procedure:  Myocardial perfusion imaging was performed at rest 45 minutes following the intravenous administration of Technetium 77m Sestamibi. Rest ECG: NSR - Normal EKG  Stress Procedure:  The patient received IV Lexiscan 0.4 mg over 15-seconds.  Technetium 65m Sestamibi injected at  30-seconds.  Quantitative spect images were obtained after a 45 minute delay.  During the infusion of Lexiscan the patient complained of SOB and head pressure.  These symptoms resolved in recovery.  Stress ECG: No significant change from baseline ECG  QPS Raw Data Images:  Normal; no motion artifact; normal heart/lung ratio. Stress Images:  Normal homogeneous uptake in all areas of the myocardium. Rest Images:  Normal homogeneous uptake in all areas of the myocardium. Subtraction (SDS):  No evidence of ischemia. Transient Ischemic Dilatation (Normal <1.22):  1.01 Lung/Heart Ratio (Normal <0.45):  0.33  Quantitative Gated Spect Images QGS EDV:  132 ml QGS ESV:  53 ml  Impression Exercise Capacity:  Lexiscan with no exercise. BP Response:  Normal blood pressure response. Clinical Symptoms:  There is dyspnea. ECG Impression:  No significant ST segment change suggestive of ischemia. Comparison with Prior Nuclear Study: No images to compare  Overall Impression:  Normal stress nuclear study. Visually there is RV uptake and LV appears dilated   LV Ejection Fraction: 60%.  LV Wall Motion:  NL LV Function; NL Wall Motion   [name]

## 2014-06-07 NOTE — Progress Notes (Signed)
Echo performed. 

## 2014-06-08 ENCOUNTER — Telehealth: Payer: Self-pay | Admitting: Cardiology

## 2014-06-08 NOTE — Telephone Encounter (Signed)
Please let patient know that stress test was fine 

## 2014-06-09 NOTE — Telephone Encounter (Signed)
Pt is aware.  

## 2014-06-13 ENCOUNTER — Other Ambulatory Visit (INDEPENDENT_AMBULATORY_CARE_PROVIDER_SITE_OTHER): Payer: Medicare Other

## 2014-06-13 DIAGNOSIS — Z79899 Other long term (current) drug therapy: Secondary | ICD-10-CM

## 2014-06-13 LAB — BASIC METABOLIC PANEL
BUN: 21 mg/dL (ref 6–23)
CALCIUM: 8.8 mg/dL (ref 8.4–10.5)
CO2: 25 mEq/L (ref 19–32)
Chloride: 107 mEq/L (ref 96–112)
Creatinine, Ser: 1.7 mg/dL — ABNORMAL HIGH (ref 0.4–1.5)
GFR: 42.48 mL/min — AB (ref >60.00)
GLUCOSE: 140 mg/dL — AB (ref 70–99)
Potassium: 4.1 mEq/L (ref 3.5–5.1)
Sodium: 142 mEq/L (ref 135–145)

## 2014-06-21 ENCOUNTER — Encounter: Payer: Self-pay | Admitting: Cardiology

## 2014-06-21 ENCOUNTER — Ambulatory Visit (INDEPENDENT_AMBULATORY_CARE_PROVIDER_SITE_OTHER): Payer: Medicare Other | Admitting: Cardiology

## 2014-06-21 VITALS — BP 158/70 | HR 55 | Ht 70.5 in | Wt 251.0 lb

## 2014-06-21 DIAGNOSIS — I2510 Atherosclerotic heart disease of native coronary artery without angina pectoris: Secondary | ICD-10-CM

## 2014-06-21 DIAGNOSIS — I10 Essential (primary) hypertension: Secondary | ICD-10-CM

## 2014-06-21 DIAGNOSIS — E669 Obesity, unspecified: Secondary | ICD-10-CM

## 2014-06-21 DIAGNOSIS — R600 Localized edema: Secondary | ICD-10-CM | POA: Insufficient documentation

## 2014-06-21 DIAGNOSIS — R0602 Shortness of breath: Secondary | ICD-10-CM

## 2014-06-21 DIAGNOSIS — R609 Edema, unspecified: Secondary | ICD-10-CM

## 2014-06-21 DIAGNOSIS — E7800 Pure hypercholesterolemia, unspecified: Secondary | ICD-10-CM

## 2014-06-21 MED ORDER — DOXAZOSIN MESYLATE 4 MG PO TABS: 4.0000 mg | ORAL_TABLET | Freq: Every day | ORAL | Status: AC

## 2014-06-21 MED ORDER — FUROSEMIDE 20 MG PO TABS: 20.0000 mg | ORAL_TABLET | ORAL | Status: AC

## 2014-06-21 NOTE — Progress Notes (Signed)
[address] [place], [place]  [address] Phone: [phone] Fax:  [phone]  Date:  06/21/2014   ID:  Matthew [name], DOB 06-04-1946, MRN [id number]  PCP:  Gweneth Dimitri, MD  Cardiologist:  Armanda Magic, MD     History of Present Illness: Matthew [name] is a 68 y.o. male with a history of ASCAD, HTN, OSA on CPAP, dyslipidemia and obesity who presents today for followup.  He has been having a lot of back pain and is seeing Dr. Yetta Barre with Neurosurgery. He is having a lot of problems walking due to back pain and then gets SOB.  He recently had a low risk nuclear stress test. When I saw him recently he was continuing to have SOB but BNP was fairly stable.  2D echo showed normal LVF with grade 3 diastolic dysfunction.  I thought at that time that his SOB was related to central obesity.  His last BMET his creatinine was elevated but he stated that he was having more SOB and LE edema and now presents today for followup.  He says that [place] and [place] 15 steps and got SOB which resolved with rest.  He has been having some LE edema but has been eating hot dogs and other sodium laden foods when he goes out with his friends.  He also gets no exercise according to his wife.     Wt Readings from Last 3 Encounters:  06/21/14 251 lb (113.853 kg)  06/07/14 247 lb (112.038 kg)  05/24/14 247 lb (112.038 kg)     Past Medical History  Diagnosis Date  . Obesity   . Postoperative atrial fibrillation     with no recurrence  . Dyslipidemia   . Hypertension   . Hypothyroidism     s/p RIA for goiter  . Erectile dysfunction   . Diabetes mellitus without complication 09/2010    Type II  . OSA (obstructive sleep apnea)     on CPAP machine  . CAD (coronary artery disease)     s/p CABG with LIMA to LAD, SVG to OM, SVG to PDA, SVG to acute marginal  . Hyperlipidemia     Current Outpatient Prescriptions  Medication Sig Dispense Refill  . acetaminophen  (TYLENOL ARTHRITIS PAIN) 650 MG CR tablet Take 650 mg by mouth every 8 (eight) hours as needed for pain.      Marland Kitchen amLODipine (NORVASC) 10 MG tablet Take 1 tablet (10 mg total) by mouth daily.  90 tablet  3  . aspirin 81 MG EC tablet Take 1 tablet (81 mg total) by mouth daily.      Marland Kitchen atorvastatin (LIPITOR) 40 MG tablet Take 1 tablet (40 mg total) by mouth daily at 6 PM.  90 tablet  1  . cyanocobalamin 500 MCG tablet Take 500 mcg by mouth daily.      Marland Kitchen doxazosin (CARDURA) 4 MG tablet Take 1 tablet (4 mg total) by mouth daily.  30 tablet  6  . ezetimibe (ZETIA) 10 MG tablet Take 1 tablet (10 mg total) by mouth daily.  90 tablet  3  . furosemide (LASIX) 20 MG tablet Take 1 tablet (20 mg total) by mouth daily.  30 tablet    . gabapentin (NEURONTIN) 100 MG capsule Take 300 mg by mouth 3 (three) times daily.      . isosorbide mononitrate (IMDUR) 60 MG 24 hr tablet Take 1 tablet (60 mg total) by mouth daily.  90 tablet  3  . lansoprazole (PREVACID) 15 MG capsule Take 15 mg by mouth daily at 12 noon. Take two capsules with evening meal      . levothyroxine (LEVOTHROID) 175 MCG tablet Take 175 mcg by mouth daily before breakfast.      . metFORMIN (GLUCOPHAGE-XR) 500 MG 24 hr tablet Take 500 mg by mouth daily with breakfast.      . Multiple Vitamins-Minerals (CENTRUM CARDIO PO) Take 2 tablets by mouth as needed.       . nebivolol (BYSTOLIC) 5 MG tablet Take 1 tablet (5 mg total) by mouth daily.  90 tablet  1  . nitroGLYCERIN (NITROSTAT) 0.4 MG SL tablet Place 0.4 mg under the tongue every 5 (five) minutes as needed for chest pain.      . Omega-3 Fatty Acids (FISH OIL) 1000 MG CAPS Take 3,000 mg by mouth daily at 6 (six) AM.      . ramipril (ALTACE) 10 MG capsule Take 1 capsule (10 mg total) by mouth 2 (two) times daily.  180 capsule  3  . Zn-Pyg Afri-Nettle-Saw Palmet (SAW PALMETTO COMPLEX PO) Take 2 tablets by mouth daily.       No current facility-administered medications for this visit.    Allergies:      Allergies  Allergen Reactions  . Iodine     Social History:  The patient  reports that he quit smoking about 24 years ago. His smoking use included Cigarettes. He smoked 0.00 packs per day for 20 years. He has never used smokeless tobacco. He reports that he drinks alcohol. He reports that he does not use illicit drugs.   Family History:  The patient's family history is not on file.   ROS:  Please see the history of present illness.      All other systems reviewed and negative.   PHYSICAL EXAM: VS:  BP 158/70  Pulse 55  Ht 5' 10.5" (1.791 m)  Wt 251 lb (113.853 kg)  BMI 35.49 kg/m2 Well nourished, well developed, in no acute distress HEENT: normal Neck: no JVD Cardiac:  normal S1, S2; RRR; no murmur Lungs:  clear to auscultation bilaterally, no wheezing, rhonchi or rales Abd: soft, nontender, no hepatomegaly Ext: no edema Skin: warm and dry Neuro:  CNs 2-12 intact, no focal abnormalities noted  EKG;  Sinus bradycardia at 55bpm with no ST changes  ASSESSMENT AND PLAN:  1. ASCAD with no anginal chest pain but still has some DOE  - continue ASA /Imdur  2. HTN - BP elevated today  - continue amlodipine/bystolic/ramipril/doxazosin  - he will check his BP daily for a week and call with the results 3. Dyslipidemia LDL 53 - continue Atorvastatin/Zetia  4. OSA on CPAP and tolerating well. Continue current CPAP settings. 5. DOE with low risk nuclear stress test. I think that most of his problems with DOE are related to obesity. He has a very large abdomen which restricts his lung capacity.  He also had a very sedentary lifestyle and gets no exercise.  I have encouraged him to get into an exercise program and weight loss program.   6. Chronic LE edema - continue Lasix  7.  Chronic renal insufficiency - I have recommended that he decrease his Lasix to  QOD.  I have counseled him to follow a very strict 2gm sodium diet.  Recheck BMET in 1 week 8.  Chronic diastolic CHF - he has some  mild LE edema but he is not compliant  with a low sodium diet.  I do not want to increase his Lasix due to worsening renal function.  I counseled him on a 2gm sodium diet that he needs to follow very strictly.  He also need aggressive control of his BP.  He will continue on BB.    Followup with Tereso Newcomer in 2 weeks  Followup with me in 6 months   Signed, Armanda Magic, MD 06/21/2014 3:16 PM

## 2014-06-21 NOTE — Patient Instructions (Addendum)
Your physician recommends that you continue on your current medications as directed. Please refer to the Current Medication list given to you today.  Your physician has requested that you regularly monitor and record your blood pressure readings at home. Please use the same machine at the same time of day to check your readings and record them for one week and call us with the results.   Low-Sodium Eating Plan Sodium raises blood pressure and causes water to be held in the body. Getting less sodium from food will help lower your blood pressure, reduce any swelling, and protect your heart, liver, and kidneys. We get sodium by adding salt (sodium chloride) to food. Most of our sodium comes from canned, boxed, and frozen foods. Restaurant foods, fast foods, and pizza are also very high in sodium. Even if you take medicine to lower your blood pressure or to reduce fluid in your body, getting less sodium from your food is important. WHAT IS MY PLAN? Most people should limit their sodium intake to 2,300 mg a day. Your health care provider recommends that you limit your sodium intake to __2,000 mg____ a day.  WHAT DO I NEED TO KNOW ABOUT THIS EATING PLAN? For the low-sodium eating plan, you will follow these general guidelines:  Choose foods with a % Daily Value for sodium of less than 5% (as listed on the food label).   Use salt-free seasonings or herbs instead of table salt or sea salt.   Check with your health care provider or pharmacist before using salt substitutes.   Eat fresh foods.  Eat more vegetables and fruits.  Limit canned vegetables. If you do use them, rinse them well to decrease the sodium.   Limit cheese to 1 oz (28 g) per day.   Eat lower-sodium products, often labeled as "lower sodium" or "no salt added."  Avoid foods that contain monosodium glutamate (MSG). MSG is sometimes added to [place] food and some canned foods.  Check food labels (Nutrition Facts labels) on  foods to learn how much sodium is in one serving.  Eat more home-cooked food and less restaurant, buffet, and fast food.  When eating at a restaurant, ask that your food be prepared with less salt or none, if possible.  HOW DO I READ FOOD LABELS FOR SODIUM INFORMATION? The Nutrition Facts label lists the amount of sodium in one serving of the food. If you eat more than one serving, you must multiply the listed amount of sodium by the number of servings. Food labels may also identify foods as:  Sodium free--Less than 5 mg in a serving.  Very low sodium--35 mg or less in a serving.  Low sodium--140 mg or less in a serving.  Light in sodium--50% less sodium in a serving. For example, if a food that usually has 300 mg of sodium is changed to become light in sodium, it will have 150 mg of sodium.  Reduced sodium--25% less sodium in a serving. For example, if a food that usually has 400 mg of sodium is changed to reduced sodium, it will have 300 mg of sodium. WHAT FOODS CAN I EAT? Grains Low-sodium cereals, including oats, puffed wheat and rice, and shredded wheat cereals. Low-sodium crackers. Unsalted rice and pasta. Lower-sodium bread.  Vegetables Frozen or fresh vegetables. Low-sodium or reduced-sodium canned vegetables. Low-sodium or reduced-sodium tomato sauce and paste. Low-sodium or reduced-sodium tomato and vegetable juices.  Fruits Fresh, frozen, and canned fruit. Fruit juice.  Meat and Other Protein Products  Low-sodium canned tuna and salmon. Fresh or frozen meat, poultry, seafood, and fish. Lamb. Unsalted nuts. Dried beans, peas, and lentils without added salt. Unsalted canned beans. Homemade soups without salt. Eggs.  Dairy Milk. Soy milk. Ricotta cheese. Low-sodium or reduced-sodium cheeses. Yogurt.  Condiments Fresh and dried herbs and spices. Salt-free seasonings. Onion and garlic powders. Low-sodium varieties of mustard and ketchup. Lemon juice.  Fats and  Oils Reduced-sodium salad dressings. Unsalted butter.  Other Unsalted popcorn and pretzels.  The items listed above may not be a complete list of recommended foods or beverages. Contact your dietitian for more options. WHAT FOODS ARE NOT RECOMMENDED? Grains Instant hot cereals. Bread stuffing, pancake, and biscuit mixes. Croutons. Seasoned rice or pasta mixes. Noodle soup cups. Boxed or frozen macaroni and cheese. Self-rising flour. Regular salted crackers. Vegetables Regular canned vegetables. Regular canned tomato sauce and paste. Regular tomato and vegetable juices. Frozen vegetables in sauces. Salted french fries. Olives. Rosita Fire. Relishes. Sauerkraut. Salsa. Meat and Other Protein Products Salted, canned, smoked, spiced, or pickled meats, seafood, or fish. Bacon, ham, sausage, hot dogs, corned beef, chipped beef, and packaged luncheon meats. Salt pork. Jerky. Pickled herring. Anchovies, regular canned tuna, and sardines. Salted nuts. Dairy Processed cheese and cheese spreads. Cheese curds. Blue cheese and cottage cheese. Buttermilk.  Condiments Onion and garlic salt, seasoned salt, table salt, and sea salt. Canned and packaged gravies. Worcestershire sauce. Tartar sauce. Barbecue sauce. Teriyaki sauce. Soy sauce, including reduced sodium. Steak sauce. Fish sauce. Oyster sauce. Cocktail sauce. Horseradish. Regular ketchup and mustard. Meat flavorings and tenderizers. Bouillon cubes. Hot sauce. Tabasco sauce. Marinades. Taco seasonings. Relishes. Fats and Oils Regular salad dressings. Salted butter. Margarine. Ghee. Bacon fat.  Other Potato and tortilla chips. Corn chips and puffs. Salted popcorn and pretzels. Canned or dried soups. Pizza. Frozen entrees and pot pies.  The items listed above may not be a complete list of foods and beverages to avoid. Contact your dietitian for more information. Document Released: 03/22/2002 Document Revised: 10/05/2013 Document Reviewed:  08/04/2013 [facility] Patient Information 2015 [place], [place]. This information is not intended to replace advice given to you by your health care provider. Make sure you discuss any questions you have with your health care provider.  Your physician recommends that you return for lab work in: One week on 9/15 for a BMET  Your physician recommends that you schedule a follow-up appointment in: 2 Weeks with NP or PA  Your physician wants you to follow-up in: 6 months with Dr Sherlyn Lick will receive a reminder letter in the mail two months in advance. If you don't receive a letter, please call our office to schedule the follow-up appointment.

## 2014-06-28 ENCOUNTER — Other Ambulatory Visit (INDEPENDENT_AMBULATORY_CARE_PROVIDER_SITE_OTHER): Payer: Medicare Other

## 2014-06-28 DIAGNOSIS — R0602 Shortness of breath: Secondary | ICD-10-CM

## 2014-06-28 DIAGNOSIS — I10 Essential (primary) hypertension: Secondary | ICD-10-CM

## 2014-06-28 LAB — BASIC METABOLIC PANEL
BUN: 20 mg/dL (ref 6–23)
CALCIUM: 9 mg/dL (ref 8.4–10.5)
CHLORIDE: 107 meq/L (ref 96–112)
CO2: 24 mEq/L (ref 19–32)
Creatinine, Ser: 1.6 mg/dL — ABNORMAL HIGH (ref 0.4–1.5)
GFR: 45.23 mL/min — ABNORMAL LOW (ref >60.00)
Glucose, Bld: 127 mg/dL — ABNORMAL HIGH (ref 70–99)
Potassium: 4 mEq/L (ref 3.5–5.1)
Sodium: 140 mEq/L (ref 135–145)

## 2014-07-05 ENCOUNTER — Ambulatory Visit (INDEPENDENT_AMBULATORY_CARE_PROVIDER_SITE_OTHER): Payer: Medicare Other | Admitting: Physician Assistant

## 2014-07-05 ENCOUNTER — Encounter: Payer: Self-pay | Admitting: Physician Assistant

## 2014-07-05 VITALS — BP 130/72 | HR 61 | Ht 70.5 in | Wt 250.0 lb

## 2014-07-05 DIAGNOSIS — R609 Edema, unspecified: Secondary | ICD-10-CM

## 2014-07-05 DIAGNOSIS — I6529 Occlusion and stenosis of unspecified carotid artery: Secondary | ICD-10-CM

## 2014-07-05 DIAGNOSIS — I10 Essential (primary) hypertension: Secondary | ICD-10-CM

## 2014-07-05 DIAGNOSIS — I2510 Atherosclerotic heart disease of native coronary artery without angina pectoris: Secondary | ICD-10-CM

## 2014-07-05 DIAGNOSIS — G4733 Obstructive sleep apnea (adult) (pediatric): Secondary | ICD-10-CM

## 2014-07-05 DIAGNOSIS — I5032 Chronic diastolic (congestive) heart failure: Secondary | ICD-10-CM

## 2014-07-05 DIAGNOSIS — E7800 Pure hypercholesterolemia, unspecified: Secondary | ICD-10-CM

## 2014-07-05 DIAGNOSIS — N189 Chronic kidney disease, unspecified: Secondary | ICD-10-CM | POA: Insufficient documentation

## 2014-07-05 NOTE — Patient Instructions (Signed)
Your physician has requested that you have a carotid duplex. This test is an ultrasound of the carotid arteries in your neck. It looks at blood flow through these arteries that supply the brain with blood. Allow one hour for this exam. There are no restrictions or special instructions.  PER SCOTT WEAVER, PAC HE WOULD LIKE FOR YOU TO GET SOME OVER THE COUNTER COMPRESSION STOCKINGS  Your physician recommends that you continue on your current medications as directed. Please refer to the Current Medication list given to you today.

## 2014-07-05 NOTE — Progress Notes (Signed)
Cardiology Office Note    Date:  07/05/2014   ID:  Matthew [name], DOB [birth year], MRN [id number]  PCP:  Matthew Dimitri, MD  Cardiologist:  Dr. Armanda [name]     History of Present Illness: Matthew [name] is a 68 y.o. male with a hx of CAD s/p CABG in 2008, carotid stenosis s/p R CEA, HTN, CKD, OSA on CPAP, HL.  Recently seen by Dr. Armanda [name] for increasing dyspnea.  Myoview was low risk.  He had worsening renal function with increased Lasix.  This was reduced.  Dyspnea was felt to be related to obesity.  His BP was elevated at last visit with Dr. Mayford [name] 06/21/14.  Patient was to monitor BP and follow low Na diet.  He returns for FU. He is doing well.  He denies chest pain, syncope, orthopnea, PND.  He wears CPAP nightly. Dyspnea is unchanged.  He is NYHA 2b.  LE edema is unchanged.  He is trying to lose weight.   Of note, he is moving to [place] 08/12/2014.     Studies:  - LHC (10/08):  3v CAD >> CABG (L-LAD, S-OM, S-PDA, S-AM)  - Echo (8/15):  Mod LVH, EF 55-60%, no RWMA, Gr 3 DD, MAC, mild LAE, mild RVE, PASP 32 mmHg.  - Nuclear (8/15):  No ischemia, EF 60%; Normal study   Recent Labs/Images: 01/14/2014: ALT 26; HDL Cholesterol by NMR 37*; LDL (calc) 53  05/24/2014: Pro B Natriuretic peptide (BNP) 142.0*  06/28/2014: Creatinine 1.6*; Potassium 4.0     Wt Readings from Last 3 Encounters:  07/05/14 250 lb (113.399 kg)  06/21/14 251 lb (113.853 kg)  06/07/14 247 lb (112.038 kg)     Past Medical History  Diagnosis Date  . Obesity   . Postoperative atrial fibrillation     with no recurrence  . Dyslipidemia   . Hypertension   . Hypothyroidism     s/p RIA for goiter  . Erectile dysfunction   . Diabetes mellitus without complication 09/2010    Type II  . OSA (obstructive sleep apnea)     on CPAP machine  . CAD (coronary artery disease)     s/p CABG with LIMA to LAD, SVG to OM, SVG to PDA, SVG to acute marginal  . Hyperlipidemia     Current Outpatient Prescriptions   Medication Sig Dispense Refill  . amLODipine (NORVASC) 10 MG tablet Take 1 tablet (10 mg total) by mouth daily.  90 tablet  3  . aspirin 81 MG EC tablet Take 1 tablet (81 mg total) by mouth daily.      Marland Kitchen atorvastatin (LIPITOR) 40 MG tablet Take 1 tablet (40 mg total) by mouth daily at 6 PM.  90 tablet  1  . cyanocobalamin 500 MCG tablet Take 500 mcg by mouth daily.      Marland Kitchen doxazosin (CARDURA) 4 MG tablet Take 1 tablet (4 mg total) by mouth daily.  90 tablet  3  . ezetimibe (ZETIA) 10 MG tablet Take 1 tablet (10 mg total) by mouth daily.  90 tablet  3  . furosemide (LASIX) 20 MG tablet Take 1 tablet (20 mg total) by mouth every other day.  30 tablet    . gabapentin (NEURONTIN) 100 MG capsule Take 300 mg by mouth 3 (three) times daily.      . isosorbide mononitrate (IMDUR) 60 MG 24 hr tablet Take 1 tablet (60 mg total) by mouth daily.  90 tablet  3  . lansoprazole (PREVACID) 15  MG capsule Take 15 mg by mouth daily at 12 noon. Take two capsules with evening meal      . levothyroxine (LEVOTHROID) 175 MCG tablet Take 175 mcg by mouth daily before breakfast.      . metFORMIN (GLUCOPHAGE-XR) 500 MG 24 hr tablet Take 500 mg by mouth daily with breakfast.      . Multiple Vitamins-Minerals (CENTRUM CARDIO PO) Take 2 tablets by mouth as needed.       . nebivolol (BYSTOLIC) 5 MG tablet Take 1 tablet (5 mg total) by mouth daily.  90 tablet  1  . nitroGLYCERIN (NITROSTAT) 0.4 MG SL tablet Place 0.4 mg under the tongue every 5 (five) minutes as needed for chest pain.      . Omega-3 Fatty Acids (FISH OIL) 1000 MG CAPS Take 3,000 mg by mouth daily at 6 (six) AM.      . ramipril (ALTACE) 10 MG capsule Take 1 capsule (10 mg total) by mouth 2 (two) times daily.  180 capsule  3  . Zn-Pyg Afri-Nettle-Saw Palmet (SAW PALMETTO COMPLEX PO) Take 2 tablets by mouth daily.       No current facility-administered medications for this visit.     Allergies:   Iodine   Social History:  The patient  reports that he quit  smoking about 24 years ago. His smoking use included Cigarettes. He smoked 0.00 packs per day for 20 years. He has never used smokeless tobacco. He reports that he drinks alcohol. He reports that he does not use illicit drugs.   Family History:  The patient's family history includes Heart attack in his mother; Hypertension in his mother.   ROS:  Please see the history of present illness.      All other systems reviewed and negative.   PHYSICAL EXAM: VS:  BP 130/72  Pulse 61  Ht 5' 10.5" (1.791 m)  Wt 250 lb (113.399 kg)  BMI 35.35 kg/m2 Well nourished, well developed, in no acute distress HEENT: normal Neck: no JVD Cardiac:  normal S1, S2; RRR; no murmur Lungs:  clear to auscultation bilaterally, no wheezing, rhonchi or rales Abd: soft, nontender, no hepatomegaly Ext: tight 1-2+ bilateral LE  edema Skin: warm and dry Neuro:  CNs 2-12 intact, no focal abnormalities noted  EKG:  NSR, HR 61, normal axis, NSSTTW changes, PVC     ASSESSMENT AND PLAN:  1. Essential hypertension, benign:  BP better controlled.  Continue current Rx.  2. Atherosclerosis of native coronary artery of native heart without angina pectoris:  No angina.  Recent Myoview low risk.  Continue ASA, statin, nitrates, beta blocker, amlodipine.   3. Occlusion and stenosis of carotid artery without mention of cerebral infarction - It has been a few years since his last Carotid US.  Plan: Carotid duplex 4. Chronic diastolic heart failure:  Volume management limited by worsening renal function.  Current status is stable. Dyspnea felt to multifactorial and worsened by obesity.  Weight loss has been recommended.  5. Pure hypercholesterolemia:  Continue statin.  6. Edema:  Continue current dose of Furosemide.  I have recommended he use bilateral knee high compression stockings.  7. Obstructive sleep apnea (adult) (pediatric):  Continue CPAP.  8. CKD (chronic kidney disease), unspecified stage:  Recent Creatinine stable.    Disposition:  FU with Dr. Armanda [name] as needed.  He will establish with a new cardiologist when he gets settled in [place].    Signed, Matthew Newcomer, PA-C, MHS 07/05/2014 2:01 PM  [facility] [place], [place],   [address] Phone: [phone]; Fax: [phone]

## 2014-07-07 ENCOUNTER — Ambulatory Visit (HOSPITAL_COMMUNITY): Payer: Medicare Other | Attending: Internal Medicine | Admitting: Radiology

## 2014-07-07 ENCOUNTER — Encounter (HOSPITAL_COMMUNITY): Payer: Medicare Other

## 2014-07-07 DIAGNOSIS — E119 Type 2 diabetes mellitus without complications: Secondary | ICD-10-CM | POA: Diagnosis not present

## 2014-07-07 DIAGNOSIS — Z87891 Personal history of nicotine dependence: Secondary | ICD-10-CM | POA: Diagnosis not present

## 2014-07-07 DIAGNOSIS — I10 Essential (primary) hypertension: Secondary | ICD-10-CM | POA: Insufficient documentation

## 2014-07-07 DIAGNOSIS — I2510 Atherosclerotic heart disease of native coronary artery without angina pectoris: Secondary | ICD-10-CM | POA: Insufficient documentation

## 2014-07-07 DIAGNOSIS — E785 Hyperlipidemia, unspecified: Secondary | ICD-10-CM | POA: Insufficient documentation

## 2014-07-07 DIAGNOSIS — I6529 Occlusion and stenosis of unspecified carotid artery: Secondary | ICD-10-CM | POA: Diagnosis not present

## 2014-07-07 NOTE — Progress Notes (Signed)
Carotid Duplex performed. 

## 2014-07-08 ENCOUNTER — Encounter: Payer: Self-pay | Admitting: Physician Assistant

## 2014-07-20 ENCOUNTER — Other Ambulatory Visit (INDEPENDENT_AMBULATORY_CARE_PROVIDER_SITE_OTHER): Payer: Medicare Other | Admitting: *Deleted

## 2014-07-20 ENCOUNTER — Other Ambulatory Visit: Payer: Self-pay | Admitting: *Deleted

## 2014-07-20 DIAGNOSIS — E7800 Pure hypercholesterolemia, unspecified: Secondary | ICD-10-CM

## 2014-07-20 LAB — HEPATIC FUNCTION PANEL
ALBUMIN: 3.4 g/dL — AB (ref 3.5–5.2)
ALT: 19 U/L (ref 0–53)
AST: 22 U/L (ref 0–37)
Alkaline Phosphatase: 56 U/L (ref 39–117)
Bilirubin, Direct: 0.3 mg/dL (ref 0.0–0.3)
TOTAL PROTEIN: 7.6 g/dL (ref 6.0–8.3)
Total Bilirubin: 1.3 mg/dL — ABNORMAL HIGH (ref 0.2–1.2)

## 2014-07-20 LAB — LIPID PANEL
CHOLESTEROL: 152 mg/dL (ref 0–200)
HDL: 29.6 mg/dL — AB (ref >39.00)
LDL CALC: 97 mg/dL (ref 0–99)
NonHDL: 122.4
TRIGLYCERIDES: 129 mg/dL (ref 0.0–149.0)
Total CHOL/HDL Ratio: 5
VLDL: 25.8 mg/dL (ref 0.0–40.0)

## 2014-07-20 MED ORDER — RAMIPRIL 10 MG PO CAPS: 10.0000 mg | ORAL_CAPSULE | Freq: Two times a day (BID) | ORAL | Status: AC

## 2014-07-20 MED ORDER — AMLODIPINE BESYLATE 10 MG PO TABS: 10.0000 mg | ORAL_TABLET | Freq: Every day | ORAL | Status: AC

## 2014-07-21 ENCOUNTER — Other Ambulatory Visit: Payer: Self-pay | Admitting: *Deleted

## 2014-07-21 MED ORDER — ATORVASTATIN CALCIUM 80 MG PO TABS: 80.0000 mg | ORAL_TABLET | Freq: Every day | ORAL | Status: AC

## 2014-09-16 ENCOUNTER — Other Ambulatory Visit: Payer: Self-pay

## 2014-09-16 MED ORDER — NEBIVOLOL HCL 5 MG PO TABS: 5.0000 mg | ORAL_TABLET | Freq: Every day | ORAL | Status: AC

## 2014-10-12 ENCOUNTER — Encounter: Payer: Self-pay | Admitting: Cardiology
